# Patient Record
Sex: Female | Born: 1955
Health system: Southern US, Community
[De-identification: ages and names within clinical notes are randomized; demographics above are authoritative.]

## PROBLEM LIST (undated history)

## (undated) DIAGNOSIS — T7840XA Allergy, unspecified, initial encounter: Secondary | ICD-10-CM

## (undated) DIAGNOSIS — H269 Unspecified cataract: Secondary | ICD-10-CM

## (undated) DIAGNOSIS — M8580 Other specified disorders of bone density and structure, unspecified site: Secondary | ICD-10-CM

## (undated) DIAGNOSIS — E785 Hyperlipidemia, unspecified: Secondary | ICD-10-CM

## (undated) HISTORY — DX: Allergy, unspecified, initial encounter: T78.40XA

## (undated) HISTORY — PX: APPENDECTOMY: SHX54

## (undated) HISTORY — DX: Unspecified cataract: H26.9

## (undated) HISTORY — PX: COLONOSCOPY: SHX174

## (undated) HISTORY — DX: Other specified disorders of bone density and structure, unspecified site: M85.80

## (undated) HISTORY — DX: Hyperlipidemia, unspecified: E78.5

---

## 1999-02-09 ENCOUNTER — Other Ambulatory Visit: Admission: RE | Admit: 1999-02-09 | Discharge: 1999-02-09 | Payer: Self-pay | Admitting: Gynecology

## 2000-02-01 ENCOUNTER — Other Ambulatory Visit: Admission: RE | Admit: 2000-02-01 | Discharge: 2000-02-01 | Payer: Self-pay | Admitting: Gynecology

## 2001-03-25 ENCOUNTER — Other Ambulatory Visit: Admission: RE | Admit: 2001-03-25 | Discharge: 2001-03-25 | Payer: Self-pay | Admitting: Gynecology

## 2002-04-20 ENCOUNTER — Other Ambulatory Visit: Admission: RE | Admit: 2002-04-20 | Discharge: 2002-04-20 | Payer: Self-pay | Admitting: Obstetrics and Gynecology

## 2003-04-01 ENCOUNTER — Other Ambulatory Visit: Admission: RE | Admit: 2003-04-01 | Discharge: 2003-04-01 | Payer: Self-pay | Admitting: Gynecology

## 2004-10-12 ENCOUNTER — Other Ambulatory Visit: Admission: RE | Admit: 2004-10-12 | Discharge: 2004-10-12 | Payer: Self-pay | Admitting: Gynecology

## 2010-01-22 HISTORY — PX: APPENDECTOMY: SHX54

## 2010-11-06 ENCOUNTER — Ambulatory Visit (HOSPITAL_COMMUNITY)
Admission: EM | Admit: 2010-11-06 | Discharge: 2010-11-07 | Disposition: A | Discharge status: Home / Self Care | Payer: BC Managed Care – PPO | Attending: Surgery | Admitting: Surgery

## 2010-11-06 ENCOUNTER — Other Ambulatory Visit (INDEPENDENT_AMBULATORY_CARE_PROVIDER_SITE_OTHER): Payer: Self-pay | Admitting: Surgery

## 2010-11-06 ENCOUNTER — Emergency Department (HOSPITAL_BASED_OUTPATIENT_CLINIC_OR_DEPARTMENT_OTHER)
Admission: EM | Admit: 2010-11-06 | Discharge: 2010-11-06 | Disposition: A | Discharge status: Other Acute Inpatient Hospital | Payer: BC Managed Care – PPO | Source: Home / Self Care | Attending: Emergency Medicine | Admitting: Emergency Medicine

## 2010-11-06 ENCOUNTER — Emergency Department (INDEPENDENT_AMBULATORY_CARE_PROVIDER_SITE_OTHER): Payer: BC Managed Care – PPO

## 2010-11-06 DIAGNOSIS — N390 Urinary tract infection, site not specified: Secondary | ICD-10-CM | POA: Insufficient documentation

## 2010-11-06 DIAGNOSIS — R1031 Right lower quadrant pain: Secondary | ICD-10-CM

## 2010-11-06 DIAGNOSIS — K3580 Unspecified acute appendicitis: Secondary | ICD-10-CM | POA: Insufficient documentation

## 2010-11-06 DIAGNOSIS — N200 Calculus of kidney: Secondary | ICD-10-CM | POA: Insufficient documentation

## 2010-11-06 DIAGNOSIS — K37 Unspecified appendicitis: Secondary | ICD-10-CM

## 2010-11-06 DIAGNOSIS — Q619 Cystic kidney disease, unspecified: Secondary | ICD-10-CM | POA: Insufficient documentation

## 2010-11-06 DIAGNOSIS — R112 Nausea with vomiting, unspecified: Secondary | ICD-10-CM

## 2010-11-06 DIAGNOSIS — R110 Nausea: Secondary | ICD-10-CM | POA: Insufficient documentation

## 2010-11-06 LAB — COMPREHENSIVE METABOLIC PANEL
ALT: 24 U/L (ref 0–35)
BUN: 12 mg/dL (ref 6–23)
CO2: 27 mEq/L (ref 19–32)
Calcium: 9.3 mg/dL (ref 8.4–10.5)
Creatinine, Ser: 0.7 mg/dL (ref 0.50–1.10)
GFR calc Af Amer: >90 mL/min (ref >90)
GFR calc non Af Amer: >90 mL/min (ref >90)
Glucose, Bld: 128 mg/dL — ABNORMAL HIGH (ref 70–99)
Total Protein: 7.2 g/dL (ref 6.0–8.3)

## 2010-11-06 LAB — DIFFERENTIAL
Eosinophils Absolute: 0 10*3/uL (ref 0.0–0.7)
Eosinophils Relative: 0 % (ref 0–5)
Lymphocytes Relative: 5 % — ABNORMAL LOW (ref 12–46)
Lymphs Abs: 0.9 10*3/uL (ref 0.7–4.0)
Monocytes Absolute: 1.2 10*3/uL — ABNORMAL HIGH (ref 0.1–1.0)
Monocytes Relative: 8 % (ref 3–12)

## 2010-11-06 LAB — CBC
HCT: 44.5 % (ref 36.0–46.0)
MCH: 33.1 pg (ref 26.0–34.0)
MCV: 93.9 fL (ref 78.0–100.0)
RBC: 4.74 MIL/uL (ref 3.87–5.11)
WBC: 16.2 10*3/uL — ABNORMAL HIGH (ref 4.0–10.5)

## 2010-11-06 LAB — URINALYSIS, ROUTINE W REFLEX MICROSCOPIC
Hgb urine dipstick: NEGATIVE
Nitrite: NEGATIVE
Protein, ur: 30 mg/dL — AB
Urobilinogen, UA: 0.2 mg/dL (ref 0.0–1.0)

## 2010-11-06 LAB — URINE MICROSCOPIC-ADD ON

## 2010-11-06 LAB — LIPASE, BLOOD: Lipase: 25 U/L (ref 11–59)

## 2010-11-06 MED ORDER — IOHEXOL 300 MG/ML  SOLN
100.0000 mL | Freq: Once | INTRAMUSCULAR | Status: AC | PRN
  Administered 2010-11-06: 100 mL via INTRAVENOUS

## 2010-11-06 MED ORDER — METRONIDAZOLE IN NACL 5-0.79 MG/ML-% IV SOLN
500.0000 mg | Freq: Once | INTRAVENOUS | Status: AC
  Administered 2010-11-06: 500 mg via INTRAVENOUS
  Filled 2010-11-06: qty 100

## 2010-11-06 MED ORDER — SODIUM CHLORIDE 0.9 % IV SOLN
Freq: Once | INTRAVENOUS | Status: AC
  Administered 2010-11-06: 11:00:00 via INTRAVENOUS

## 2010-11-06 MED ORDER — ONDANSETRON HCL 4 MG/2ML IJ SOLN
4.0000 mg | Freq: Once | INTRAMUSCULAR | Status: AC
  Administered 2010-11-06: 4 mg via INTRAVENOUS
  Filled 2010-11-06: qty 2

## 2010-11-06 MED ORDER — MORPHINE SULFATE 4 MG/ML IJ SOLN
4.0000 mg | Freq: Once | INTRAMUSCULAR | Status: AC
  Administered 2010-11-06: 4 mg via INTRAVENOUS

## 2010-11-06 MED ORDER — SODIUM CHLORIDE 0.9 % IV BOLUS (SEPSIS)
1000.0000 mL | Freq: Once | INTRAVENOUS | Status: AC
  Administered 2010-11-06: 1000 mL via INTRAVENOUS

## 2010-11-06 MED ORDER — CIPROFLOXACIN IN D5W 400 MG/200ML IV SOLN
400.0000 mg | Freq: Once | INTRAVENOUS | Status: AC
Start: 2010-11-06 — End: 2010-11-06
  Administered 2010-11-06: 400 mg via INTRAVENOUS
  Filled 2010-11-06: qty 200

## 2010-11-06 MED ORDER — MORPHINE SULFATE 4 MG/ML IJ SOLN
INTRAMUSCULAR | Status: AC
  Administered 2010-11-06: 4 mg via INTRAVENOUS
  Filled 2010-11-06: qty 1

## 2010-11-06 MED ORDER — MORPHINE SULFATE 4 MG/ML IJ SOLN
4.0000 mg | Freq: Once | INTRAMUSCULAR | Status: AC
  Administered 2010-11-06: 4 mg via INTRAVENOUS
  Filled 2010-11-06: qty 1

## 2010-11-06 NOTE — ED Notes (Signed)
Temp of 101.1

## 2010-11-06 NOTE — ED Notes (Signed)
Report called to [facility] 

## 2010-11-06 NOTE — ED Notes (Signed)
Abdominal pain and nausea that started yesterday

## 2010-11-06 NOTE — ED Notes (Signed)
Dr. Hyman Hopes at bedside speaking with pt and family regarding plan of care.

## 2010-11-06 NOTE — ED Notes (Signed)
Pt drinking PO contrast.  Pt and spouse informed of plan of care.

## 2010-11-06 NOTE — ED Notes (Signed)
[facility] at bedside and preparing for transport.  IV documented as d/c'd but pt actually left in place for transport.

## 2010-11-06 NOTE — ED Notes (Signed)
Pt transported to CT ?

## 2010-11-06 NOTE — ED Provider Notes (Signed)
History     CSN: [id number] Arrival date & time: 11/06/2010  7:15 AM  Chief Complaint  Patient presents with  . Abdominal Pain  . Nausea    (Consider location/radiation/quality/duration/timing/severity/associated sxs/prior treatment) HPI 55 year old female previously healthy presents with abdominal pain. Patient states that she began to have diffuse greater than right lower quadrant abdominal pain yesterday. The pain has been crampy and constant with intermittent worsening. Patient complain of nausea and vomiting. She states that she has been unable to tolerate by mouth at home. She denies any back pain. She denies constipation, diarrhea, blood in her stool. She denies any hematuria dysuria frequency urgency. Patient has reported low-grade fevers at home to 100.1.  No sick contacts. She denies chest pain shortness of breath. She has a mild headache she denies neck stiffness, dizziness. She denies rash. No history of abdominal surgeries  History reviewed. No pertinent past medical history.  History reviewed. No pertinent past surgical history.  No family history on file.  History  Substance Use Topics  . Smoking status: Never Smoker   . Smokeless tobacco: Never Used  . Alcohol Use: Yes     wine daily    OB History    Grav Para Term Preterm Abortions TAB SAB Ect Mult Living                  Review of Systems Negative except as noted in history of present illness  Allergies  Duricef  Home Medications  No current outpatient prescriptions on file. none  BP 105/61  Pulse 89  Temp(Src) 99 F (37.2 C) (Oral)  Resp 16  Ht 5\' 3"  (1.6 m)  Wt 102 lb (46.267 kg)  BMI 18.07 kg/m2  SpO2 99%  Physical Exam  Nursing note and vitals reviewed. Constitutional: She is oriented to person, place, and time. She appears well-developed.        Appears uncomfortable  HENT:  Head: Atraumatic.  Mouth/Throat: Oropharynx is clear and moist.  Eyes: Conjunctivae and EOM are normal.  Pupils are equal, round, and reactive to light.  Neck: Normal range of motion. Neck supple.  Cardiovascular: Normal rate, regular rhythm, normal heart sounds and intact distal pulses.   Pulmonary/Chest: Effort normal and breath sounds normal. No respiratory distress. She has no wheezes. She has no rales.  Abdominal: Soft. She exhibits no distension and no mass. There is tenderness. There is no rebound and no guarding.       Diffuse lower abdominal and periumbilical tenderness to palpation  Musculoskeletal: Normal range of motion.  Neurological: She is alert and oriented to person, place, and time.  Skin: Skin is warm and dry. No rash noted.  Psychiatric: She has a normal mood and affect.    ED Course  Procedures (including critical care time)  Labs Reviewed  CBC - Abnormal; Notable for the following:    WBC 16.2 (*)    Hemoglobin 15.7 (*)    All other components within normal limits  DIFFERENTIAL - Abnormal; Notable for the following:    Neutrophils Relative 87 (*)    Neutro Abs 14.1 (*)    Lymphocytes Relative 5 (*)    Monocytes Absolute 1.2 (*)    All other components within normal limits  COMPREHENSIVE METABOLIC PANEL - Abnormal; Notable for the following:    Glucose, Bld 128 (*)    All other components within normal limits  URINALYSIS, ROUTINE W REFLEX MICROSCOPIC - Abnormal; Notable for the following:    Color, Urine AMBER (*)  BIOCHEMICALS MAY BE AFFECTED BY COLOR   Appearance CLOUDY (*)    Ketones, ur 15 (*)    Protein, ur 30 (*)    Leukocytes, UA SMALL (*)    All other components within normal limits  URINE MICROSCOPIC-ADD ON - Abnormal; Notable for the following:    Bacteria, UA MANY (*)    All other components within normal limits  LIPASE, BLOOD   Ct Abdomen Pelvis W Contrast  11/06/2010  *RADIOLOGY REPORT*  Clinical Data: Right lower quadrant abdominal pain.  CT ABDOMEN AND PELVIS WITH CONTRAST  Technique:  Multidetector CT imaging of the abdomen and pelvis was  performed following the standard protocol during bolus administration of intravenous contrast.  Contrast: OMNIPAQUE IOHEXOL 300 MG/ML IV SOLN  Comparison: None  Findings: The lung bases are clear.  The solid abdominal organs are unremarkable.  Mild periportal edema is noted in the liver.  The gallbladder is unremarkable.  The common bile duct and pancreatic duct are normal. A 5 mm renal calculus is noted in the right renal pelvis.  Small renal cysts are noted.  The stomach, duodenum, small bowel and colon are unremarkable.  The appendix is fluid filled, dilated and demonstrates mucosal enhancement and significant periappendiceal inflammatory change all consistent with acute appendicitis.  The uterus and ovaries are unremarkable.  There is a small amount of free pelvic fluid.  The bladder appears normal.  The aorta is normal in caliber.  The major branch vessels are normal.  No mesenteric or retroperitoneal masses or adenopathy.  The bony structures are unremarkable.  IMPRESSION: CT findings consistent with acute appendicitis.  Original Report Authenticated By: P. Loralie Champagne, M.D.     1. Appendicitis       MDM  55 year old female with nausea, abdominal pain, low-grade fever at home. CT abdomen and pelvis to evaluate for appendicitis, colitis. UA, labs. A fluids Zofran and morphine. Reassess.  Stefano Gaul, MD   Acute appy. Abx given. D/W Surgery-- transfer to [facility]. D/W ER MD at [facility], MD 11/06/10 [phone]

## 2010-11-06 NOTE — ED Notes (Signed)
MD at bedside. 

## 2010-11-06 NOTE — ED Notes (Signed)
Pt returned from CT °

## 2010-11-06 NOTE — ED Notes (Signed)
Pt transported to [facility] ED via [facility] and stable upon transfer. Pain 2/10.

## 2010-11-07 NOTE — Op Note (Signed)
  NAMECADE, OLBERDING                 ACCOUNT NO.:  [id number]  MEDICAL RECORD NO.:  [id number]  LOCATION:  0098                         FACILITY:  [facility]  PHYSICIAN:  Wilmon Arms. Corliss Skains, M.D. DATE OF BIRTH:  07/29/1955  DATE OF PROCEDURE:  11/06/2010 DATE OF DISCHARGE:                              OPERATIVE REPORT   PREOPERATIVE DIAGNOSIS:  Acute appendicitis.  POSTOPERATIVE DIAGNOSIS:  Acute appendicitis.  PROCEDURE:  Laparoscopic appendectomy.  SURGEON:  Wilmon Arms. Chamia Schmutz, M.D.  ANESTHESIA:  General.  INDICATIONS:  This is a 55 year old female, who was diagnosed with acute appendicitis at [facility] was transferred for further treatment.  We recommended immediate laparoscopic appendectomy.  DESCRIPTION OF PROCEDURE:  Patient brought to the operating room, placed supine position on operating table.  After an adequate level of general anesthesia was obtained, patient's abdomen was prepped with ChloraPrep, draped in sterile fashion.  Time-out was taken to the proper patient, proper procedure.  A Foley had been placed to decompress the bladder. We infiltrated the area just above her umbilicus with 0.25% Marcaine with epinephrine.  A transverse incision was made.  Dissection was carried down the fascia.  The fascia was opened vertically.  We entered the peritoneal cavity bluntly.  A stay suture of 0 Vicryl was placed around the fascial opening.  The Hasson cannula was inserted, secured to stay suture.  Pneumoperitoneum was obtained, insufflating CO2, maintaining maximal pressure of 15 mmHg.  The laparoscope was inserted and the patient was positioned in Trendelenburg, tilted to her left.  A 5 mm ports placed in right upper quadrant.  Another 5 mm port was placed in the left lower quadrant.  We moved the scope to the right upper quadrant port site.  We mobilized the cecum medially.  A large inflamed nonperforated appendix was identified.  We mobilized the appendix  by dividing some of this adhesions with the Harmonic Scalpel.  Once we had mobilized the appendix, we divided the mesoappendix with the Harmonic Scalpel.  The base of the appendix was then divided with the blue Endo- GIA stapler.  The appendix was placed in EndoCatch sac.  We inspected the staple line, which seemed hemostatic with no sign of leak.  We thoroughly irrigated the right lower quadrant, no bleeding was noted. The appendix was then removed through the umbilical port site.  The fascia was closed with the purse-string suture.  Pneumoperitoneum was then released as we removed the trocars.  A 4-0 Monocryl was used to close skin incisions.  Steri- Strips and clean dressings were applied.  The patient was extubated and brought to the recovery room in stable condition.  All sponge, instrument, needle counts were correct.     Wilmon Arms. Corliss Skains, M.D.     MKT/MEDQ  D:  11/06/2010  T:  11/07/2010  Job:  [id number]  Electronically Signed by Manus Rudd M.D. on 11/07/2010 03:25:05 PM

## 2010-11-15 NOTE — H&P (Signed)
NAMECHASTITY, [name] Nicholson                 ACCOUNT NO.:  [id number]  MEDICAL RECORD NO.:  [id number]  LOCATION:  WLED                         FACILITY:  [facility]  PHYSICIAN:  [name] Nicholson, M.D. DATE OF BIRTH:  July 07, 1955  DATE OF ADMISSION:  11/06/2010 DATE OF DISCHARGE:                             HISTORY & PHYSICAL   REQUESTING PHYSICIAN:  Dr. Radford Nicholson, ER.  PRIMARY CARE PHYSICIAN:  None.  She sees Dr. Jennette Nicholson, OB/GYN.  CHIEF COMPLAINT:  Abdominal pain.  BRIEF HISTORY:  The patient is a 55 year old white female who is in excellent health normally.  She woke up Sunday a.m. with pain, had 3 episodes of nausea and dry heaves throughout the day.  She was unable to eat throughout the day on Sunday.  She thought she had a virus.  It has become progressively worse.  She did spike some temperatures at home, and she ultimately presented to the ER at [facility] around 6:00 am. Workup at [facility] included CBC which shows a white count of 16,200, hemoglobin 15, hematocrit 44, and platelets are 215,000.  Sodium is 137, potassium is 4.1, chloride is 101, CO2 is 27, BUN is 12, creatinine is 0.7, and glucose is 128.  Lipase was 25.  LFTs were normal.  CT of the abdomen shows the solid organs are unremarkable.  There is some periportal edema noted in the liver.  The gallbladder was unremarkable. Common bile duct and pancreatic ducts were normal.  There was a 5 mm renal calculus noted on the right.  There is a small renal cysts noted. Stomach, duodenum, small bowel, and colon are unremarkable.  The appendix is filled with food, dilated and demonstrated mucosal enhancement and significant periappendiceal inflammatory changes all consistent with acute appendicitis.  The uterus and ovaries were unremarkable.  There is a small amount of free pelvic fluid.  The bladder appears normal.  UA on admission showed 21-50 wbc's also.  PAST MEDICAL HISTORY:  No prior medical history.  PAST SURGICAL HISTORY:   None.  FAMILY HISTORY:  Father died at 23 with an MI.  Mother died at 52 with a stroke.  One brother in good health at 49.  Two sisters, one with some coronary artery disease.  SOCIAL HISTORY:  Tobacco none.  Alcohol, social. A glass of wine per day.  Drugs none.  She is married.  She was at home.  She goes to the gym almost daily Monday through Friday, 5 days a week, and cares for her grandchildren.  REVIEW OF SYSTEMS:  Fever:  None till yesterday, 101 on and off yesterday.  SKIN:  No changes.  CEREBROVASCULAR:  No changes. PULMONARY:  No shortness of breath, DOE or PND.  She works at [employer] with no issues.  CARDIAC:  No chest pain or palpitations.  GI:  Negative for GERD.  She has had nausea and dry heaves yesterday.  She has been unable to eat since yesterday.  No diarrhea or constipation or blood in her stool.  GYN:  No periods for 2 years.  MUSCULOSKELETAL:  No changes. PSYCH:  No changes.  CURRENT ALLERGIES:  None.  ALLERGIES:  DURICEF causes rash.  PHYSICAL  EXAMINATION:  GENERAL:  This is a thin well-nourished white female, in no acute distress.  VITAL SIGNS:  Temperature currently is 99.6, heart rate is 86, blood pressure is 100/61 respiratory rate is 18, and sats are 98% on room air. HEAD:  Normocephalic. EARS, NOSE, THROAT, AND MOUTH:  All within normal limits.  Trachea is in the midline.  No bruits, JVD, or thyromegaly.  CHEST:  Clear to auscultation and percussion. CARDIAC:  Normal S1 and S2. ABDOMEN:  Normal bowel sounds.  She is not distended.  She is tender in the right lower quadrant in the mid to left lower quadrant.  No hernias, masses, or abscesses noted. GU/RECTAL:  Deferred. MUSCULOSKELETAL:  No changes. NEUROLOGIC:  No focal changes.  Cranial nerves grossly intact. PSYCH:  Normal affect.  IMPRESSION: 1. Acute appendicitis. 2. Urinary tract infection.  PLAN:  The patient has been treated at [facility] with Cipro and Flagyl, morphine and Zofran.  We  will plan to take her to the OR as soon as possible.  Risks and benefits were discussed.  The patient, husband and daughter were in attendance and were in agreement.     [name] Nicholson, P.A.   ______________________________ [name] Nicholson, M.D.    WDJ/MEDQ  D:  11/06/2010  T:  11/06/2010  Job:  [id number]  Electronically Signed by [name] Nicholson P.A. on 11/12/2010 01:24:18 PM Electronically Signed by [name] Nicholson M.D. on 11/15/2010 04:37:31 AM

## 2010-11-28 ENCOUNTER — Encounter (INDEPENDENT_AMBULATORY_CARE_PROVIDER_SITE_OTHER): Payer: Self-pay | Admitting: Surgery

## 2010-11-30 ENCOUNTER — Ambulatory Visit (INDEPENDENT_AMBULATORY_CARE_PROVIDER_SITE_OTHER): Payer: BC Managed Care – PPO | Admitting: Surgery

## 2010-11-30 ENCOUNTER — Encounter (INDEPENDENT_AMBULATORY_CARE_PROVIDER_SITE_OTHER): Payer: Self-pay | Admitting: Surgery

## 2010-11-30 VITALS — BP 126/84 | HR 60 | Temp 98.4°F | Resp 12 | Ht 63.0 in | Wt 101.4 lb

## 2010-11-30 DIAGNOSIS — Z9049 Acquired absence of other specified parts of digestive tract: Secondary | ICD-10-CM

## 2010-11-30 DIAGNOSIS — Z9889 Other specified postprocedural states: Secondary | ICD-10-CM

## 2010-11-30 DIAGNOSIS — K3580 Unspecified acute appendicitis: Secondary | ICD-10-CM | POA: Insufficient documentation

## 2010-11-30 NOTE — Progress Notes (Signed)
S/p lap appy on 11/06/10 for acute appendicitis.  The patient was discharged on POD#1.  She has done very well.  She never used any narcotic pain medication.  Appetite and bowel movements are normal.  Incisions are healing with no sign of infection.    Resume full activity. Follow-up PRn.Marland Kitchen

## 2010-11-30 NOTE — Patient Instructions (Signed)
Resume full activity  

## 2011-01-01 ENCOUNTER — Other Ambulatory Visit: Payer: Self-pay | Admitting: Gynecology

## 2011-01-04 ENCOUNTER — Other Ambulatory Visit: Payer: Self-pay | Admitting: Gynecology

## 2011-01-04 DIAGNOSIS — R928 Other abnormal and inconclusive findings on diagnostic imaging of breast: Secondary | ICD-10-CM

## 2011-01-18 ENCOUNTER — Ambulatory Visit
Admission: RE | Admit: 2011-01-18 | Discharge: 2011-01-18 | Disposition: A | Discharge status: Home / Self Care | Payer: BC Managed Care – PPO | Source: Ambulatory Visit | Attending: Gynecology | Admitting: Gynecology

## 2011-01-18 DIAGNOSIS — R928 Other abnormal and inconclusive findings on diagnostic imaging of breast: Secondary | ICD-10-CM

## 2011-01-24 ENCOUNTER — Other Ambulatory Visit: Payer: BC Managed Care – PPO

## 2011-12-27 ENCOUNTER — Ambulatory Visit (INDEPENDENT_AMBULATORY_CARE_PROVIDER_SITE_OTHER): Payer: BC Managed Care – PPO | Admitting: Internal Medicine

## 2011-12-27 ENCOUNTER — Encounter: Payer: Self-pay | Admitting: Internal Medicine

## 2011-12-27 VITALS — BP 110/70 | HR 83 | Temp 97.6°F | Resp 16 | Ht 63.0 in | Wt 101.0 lb

## 2011-12-27 DIAGNOSIS — E785 Hyperlipidemia, unspecified: Secondary | ICD-10-CM

## 2011-12-27 DIAGNOSIS — Z780 Asymptomatic menopausal state: Secondary | ICD-10-CM

## 2011-12-27 NOTE — Progress Notes (Signed)
  Subjective:    Patient ID: [name], female    DOB: December 28, 1955, 56 y.o.   MRN: [id number]  HPI New pt here for first visit.  Former primary care by [employer].  PMH mild hyperlipidemia and menopause  Not very symptomatic with menopausal symptoms  Former mm with Dr.Bowie   Allergies  Allergen Reactions  . Duricef (Cefadroxil Monohydrate)    History reviewed. No pertinent past medical history. Past Surgical History  Procedure Date  . Appendectomy 2012   History   Social History  . Marital Status: Married    Spouse Name: N/A    Number of Children: N/A  . Years of Education: N/A   Occupational History  . Not on file.   Social History Main Topics  . Smoking status: Never Smoker   . Smokeless tobacco: Never Used  . Alcohol Use: 0.6 oz/week    1 Glasses of wine per week     Comment: wine daily  . Drug Use: No  . Sexually Active: Yes    Birth Control/ Protection: Post-menopausal   Other Topics Concern  . Not on file   Social History Narrative  . No narrative on file   Family History  Problem Relation Age of Onset  . Heart disease Father   . Stroke Mother    Patient Active Problem List  Diagnosis  . Acute appendicitis   No current outpatient prescriptions on file prior to visit.       Review of Systems See HPI    Objective:   Physical Exam Physical Exam  Nursing note and vitals reviewed.  Constitutional: She is oriented to person, place, and time. She appears well-developed and well-nourished.  HENT:  Head: Normocephalic and atraumatic.  Cardiovascular: Normal rate and regular rhythm. Exam reveals no gallop and no friction rub.  No murmur heard.  Pulmonary/Chest: Breath sounds normal. She has no wheezes. She has no rales.  Neurological: She is alert and oriented to person, place, and time.  Skin: Skin is warm and dry.  Psychiatric: She has a normal mood and affect. Her behavior is normal.  Ext:  No edema            Assessment & Plan:   Hyperlipidemia  Will check when pt is fasting  Menopause  Pt to schedule CPE

## 2011-12-27 NOTE — Patient Instructions (Addendum)
Schedule CPE

## 2012-01-28 ENCOUNTER — Telehealth: Payer: Self-pay | Admitting: *Deleted

## 2012-01-30 ENCOUNTER — Telehealth: Payer: Self-pay | Admitting: Internal Medicine

## 2012-02-07 NOTE — Telephone Encounter (Signed)
Returned pt call regarding billing

## 2012-02-20 ENCOUNTER — Encounter: Payer: BC Managed Care – PPO | Admitting: Internal Medicine

## 2012-02-29 ENCOUNTER — Encounter: Payer: Self-pay | Admitting: *Deleted

## 2012-05-29 ENCOUNTER — Other Ambulatory Visit: Payer: Self-pay | Admitting: Gynecology

## 2013-06-01 ENCOUNTER — Other Ambulatory Visit: Payer: Self-pay | Admitting: Gynecology

## 2013-11-23 ENCOUNTER — Encounter: Payer: Self-pay | Admitting: Internal Medicine

## 2014-05-23 LAB — HM PAP SMEAR: HM Pap smear: NORMAL

## 2014-05-23 LAB — HM MAMMOGRAPHY: HM MAMMO: NORMAL

## 2015-03-02 ENCOUNTER — Telehealth: Payer: Self-pay | Admitting: Behavioral Health

## 2015-03-02 ENCOUNTER — Encounter: Payer: Self-pay | Admitting: Behavioral Health

## 2015-03-02 NOTE — Telephone Encounter (Signed)
Pre-Visit Call completed with patient and chart updated.   Pre-Visit Info documented in Specialty Comments under [employer].    

## 2015-03-03 ENCOUNTER — Ambulatory Visit (INDEPENDENT_AMBULATORY_CARE_PROVIDER_SITE_OTHER): Payer: BLUE CROSS/BLUE SHIELD | Admitting: Family Medicine

## 2015-03-03 ENCOUNTER — Encounter: Payer: Self-pay | Admitting: Family Medicine

## 2015-03-03 VITALS — BP 100/60 | HR 82 | Temp 98.0°F | Ht 63.0 in | Wt 98.4 lb

## 2015-03-03 DIAGNOSIS — Z23 Encounter for immunization: Secondary | ICD-10-CM | POA: Diagnosis not present

## 2015-03-03 DIAGNOSIS — Z114 Encounter for screening for human immunodeficiency virus [HIV]: Secondary | ICD-10-CM

## 2015-03-03 DIAGNOSIS — Z0000 Encounter for general adult medical examination without abnormal findings: Secondary | ICD-10-CM | POA: Diagnosis not present

## 2015-03-03 DIAGNOSIS — Z1159 Encounter for screening for other viral diseases: Secondary | ICD-10-CM | POA: Diagnosis not present

## 2015-03-03 NOTE — Progress Notes (Signed)
Subjective:     [name] is a 60 y.o. female and is here for a comprehensive physical exam. The patient reports no problems.  Social History   Social History  . Marital Status: Married    Spouse Name: N/A  . Number of Children: N/A  . Years of Education: N/A   Occupational History  . Not on file.   Social History Main Topics  . Smoking status: Never Smoker   . Smokeless tobacco: Never Used  . Alcohol Use: 0.6 oz/week    1 Glasses of wine per week     Comment: wine daily  . Drug Use: No  . Sexual Activity: Yes    Birth Control/ Protection: Post-menopausal   Other Topics Concern  . Not on file   Social History Narrative   Health Maintenance  Topic Date Due  . Hepatitis C Screening  1955/05/19  . HIV Screening  02/09/1969  . TETANUS/TDAP  02/09/1973  . COLONOSCOPY  02/10/2004  . ZOSTAVAX  02/09/2014  . INFLUENZA VACCINE  08/23/2015  . MAMMOGRAM  05/22/2016  . PAP SMEAR  05/22/2017    The following portions of the patient's history were reviewed and updated as appropriate:  She  has no past medical history on file. She  does not have any pertinent problems on file. She  has past surgical history that includes Appendectomy (2012). Her family history includes Heart disease in her father; Stroke in her mother. She  reports that she has never smoked. She has never used smokeless tobacco. She reports that she drinks about 0.6 oz of alcohol per week. She reports that she does not use illicit drugs. She has a current medication list which includes the following prescription(s): aspirin and multivitamin with minerals. Current Outpatient Prescriptions on File Prior to Visit  Medication Sig Dispense Refill  . aspirin 81 MG tablet Take 81 mg by mouth daily.    . Multiple Vitamins-Minerals (MULTIVITAMIN WITH MINERALS) tablet Take 1 tablet by mouth daily.     No current facility-administered medications on file prior to visit.   She is allergic to duricef..  Review of  Systems Review of Systems  Constitutional: Negative for activity change, appetite change and fatigue.  HENT: Negative for hearing loss, congestion, tinnitus and ear discharge.  dentist q30mEyes: Negative for visual disturbance (see optho q1y -- vision corrected to 20/20 with glasses).  Respiratory: Negative for cough, chest tightness and shortness of breath.   Cardiovascular: Negative for chest pain, palpitations and leg swelling.  Gastrointestinal: Negative for abdominal pain, diarrhea, constipation and abdominal distention.  Genitourinary: Negative for urgency, frequency, decreased urine volume and difficulty urinating.  Musculoskeletal: Negative for back pain, arthralgias and gait problem.  Skin: Negative for color change, pallor and rash.  Neurological: Negative for dizziness, light-headedness, numbness and headaches.  Hematological: Negative for adenopathy. Does not bruise/bleed easily.  Psychiatric/Behavioral: Negative for suicidal ideas, confusion, sleep disturbance, self-injury, dysphoric mood, decreased concentration and agitation.       Objective:    BP 100/60 mmHg  Pulse 82  Temp(Src) 98 F (36.7 C) (Oral)  Ht '5\' 3"'  (1.6 m)  Wt 98 lb 6.4 oz (44.634 kg)  BMI 17.44 kg/m2  SpO2 97% General appearance: alert, cooperative, appears stated age and no distress Head: Normocephalic, without obvious abnormality, atraumatic Eyes: conjunctivae/corneas clear. PERRL, EOM's intact. Fundi benign. Ears: normal TM's and external ear canals both ears Nose: Nares normal. Septum midline. Mucosa normal. No drainage or sinus tenderness. Throat: lips, mucosa, and  tongue normal; teeth and gums normal Neck: no adenopathy, no carotid bruit, no JVD, supple, symmetrical, trachea midline and thyroid not enlarged, symmetric, no tenderness/mass/nodules Back: symmetric, no curvature. ROM normal. No CVA tenderness. Lungs: clear to auscultation bilaterally Breasts: gyn Heart: regular rate and rhythm,  S1, S2 normal, no murmur, click, rub or gallop Abdomen: soft, non-tender; bowel sounds normal; no masses,  no organomegaly Pelvic: deferred-gyn Extremities: extremities normal, atraumatic, no cyanosis or edema Pulses: 2+ and symmetric Skin: Skin color, texture, turgor normal. No rashes or lesions Lymph nodes: Cervical, supraclavicular, and axillary nodes normal. Neurologic: Alert and oriented X 3, normal strength and tone. Normal symmetric reflexes. Normal coordination and gait Psych-- no depression, no anxiety      Assessment:    Healthy female exam.      Plan:    ghm utd Check labs  See After Visit Summary for Counseling Recommendations    1. Preventative health care See above - Ambulatory referral to Gastroenterology - Comp Met (CMET) - CBC with Differential/Platelet - HIV antibody - Lipid panel - POCT urinalysis dipstick - TSH - Hepatitis C antibody  2. Need for hepatitis C screening test   - Hepatitis C antibody  3. Screening for HIV (human immunodeficiency virus)   - HIV antibody

## 2015-03-03 NOTE — Patient Instructions (Signed)
Preventive Care for Adults, Female A healthy lifestyle and preventive care can promote health and wellness. Preventive health guidelines for women include the following key practices.  A routine yearly physical is a good way to check with your health care provider about your health and preventive screening. It is a chance to share any concerns and updates on your health and to receive a thorough exam.  Visit your dentist for a routine exam and preventive care every 6 months. Brush your teeth twice a day and floss once a day. Good oral hygiene prevents tooth decay and gum disease.  The frequency of eye exams is based on your age, health, family medical history, use of contact lenses, and other factors. Follow your health care provider's recommendations for frequency of eye exams.  Eat a healthy diet. Foods like vegetables, fruits, whole grains, low-fat dairy products, and lean protein foods contain the nutrients you need without too many calories. Decrease your intake of foods high in solid fats, added sugars, and salt. Eat the right amount of calories for you.Get information about a proper diet from your health care provider, if necessary.  Regular physical exercise is one of the most important things you can do for your health. Most adults should get at least 150 minutes of moderate-intensity exercise (any activity that increases your heart rate and causes you to sweat) each week. In addition, most adults need muscle-strengthening exercises on 2 or more days a week.  Maintain a healthy weight. The body mass index (BMI) is a screening tool to identify possible weight problems. It provides an estimate of body fat based on height and weight. Your health care provider can find your BMI and can help you achieve or maintain a healthy weight.For adults 20 years and older:  A BMI below 18.5 is considered underweight.  A BMI of 18.5 to 24.9 is normal.  A BMI of 25 to 29.9 is considered overweight.  A  BMI of 30 and above is considered obese.  Maintain normal blood lipids and cholesterol levels by exercising and minimizing your intake of saturated fat. Eat a balanced diet with plenty of fruit and vegetables. Blood tests for lipids and cholesterol should begin at age 45 and be repeated every 5 years. If your lipid or cholesterol levels are high, you are over 50, or you are at high risk for heart disease, you may need your cholesterol levels checked more frequently.Ongoing high lipid and cholesterol levels should be treated with medicines if diet and exercise are not working.  If you smoke, find out from your health care provider how to quit. If you do not use tobacco, do not start.  Lung cancer screening is recommended for adults aged 45-80 years who are at high risk for developing lung cancer because of a history of smoking. A yearly low-dose CT scan of the lungs is recommended for people who have at least a 30-pack-year history of smoking and are a current smoker or have quit within the past 15 years. A pack year of smoking is smoking an average of 1 pack of cigarettes a day for 1 year (for example: 1 pack a day for 30 years or 2 packs a day for 15 years). Yearly screening should continue until the smoker has stopped smoking for at least 15 years. Yearly screening should be stopped for people who develop a health problem that would prevent them from having lung cancer treatment.  If you are pregnant, do not drink alcohol. If you are  breastfeeding, be very cautious about drinking alcohol. If you are not pregnant and choose to drink alcohol, do not have more than 1 drink per day. One drink is considered to be 12 ounces (355 mL) of beer, 5 ounces (148 mL) of wine, or 1.5 ounces (44 mL) of liquor.  Avoid use of street drugs. Do not share needles with anyone. Ask for help if you need support or instructions about stopping the use of drugs.  High blood pressure causes heart disease and increases the risk  of stroke. Your blood pressure should be checked at least every 1 to 2 years. Ongoing high blood pressure should be treated with medicines if weight loss and exercise do not work.  If you are 55-79 years old, ask your health care provider if you should take aspirin to prevent strokes.  Diabetes screening is done by taking a blood sample to check your blood glucose level after you have not eaten for a certain period of time (fasting). If you are not overweight and you do not have risk factors for diabetes, you should be screened once every 3 years starting at age 45. If you are overweight or obese and you are 40-70 years of age, you should be screened for diabetes every year as part of your cardiovascular risk assessment.  Breast cancer screening is essential preventive care for women. You should practice "breast self-awareness." This means understanding the normal appearance and feel of your breasts and may include breast self-examination. Any changes detected, no matter how small, should be reported to a health care provider. Women in their 20s and 30s should have a clinical breast exam (CBE) by a health care provider as part of a regular health exam every 1 to 3 years. After age 40, women should have a CBE every year. Starting at age 40, women should consider having a mammogram (breast X-ray test) every year. Women who have a family history of breast cancer should talk to their health care provider about genetic screening. Women at a high risk of breast cancer should talk to their health care providers about having an MRI and a mammogram every year.  Breast cancer gene (BRCA)-related cancer risk assessment is recommended for women who have family members with BRCA-related cancers. BRCA-related cancers include breast, ovarian, tubal, and peritoneal cancers. Having family members with these cancers may be associated with an increased risk for harmful changes (mutations) in the breast cancer genes BRCA1 and  BRCA2. Results of the assessment will determine the need for genetic counseling and BRCA1 and BRCA2 testing.  Your health care provider may recommend that you be screened regularly for cancer of the pelvic organs (ovaries, uterus, and vagina). This screening involves a pelvic examination, including checking for microscopic changes to the surface of your cervix (Pap test). You may be encouraged to have this screening done every 3 years, beginning at age 21.  For women ages 30-65, health care providers may recommend pelvic exams and Pap testing every 3 years, or they may recommend the Pap and pelvic exam, combined with testing for human papilloma virus (HPV), every 5 years. Some types of HPV increase your risk of cervical cancer. Testing for HPV may also be done on women of any age with unclear Pap test results.  Other health care providers may not recommend any screening for nonpregnant women who are considered low risk for pelvic cancer and who do not have symptoms. Ask your health care provider if a screening pelvic exam is right for   you.  If you have had past treatment for cervical cancer or a condition that could lead to cancer, you need Pap tests and screening for cancer for at least 20 years after your treatment. If Pap tests have been discontinued, your risk factors (such as having a new sexual partner) need to be reassessed to determine if screening should resume. Some women have medical problems that increase the chance of getting cervical cancer. In these cases, your health care provider may recommend more frequent screening and Pap tests.  Colorectal cancer can be detected and often prevented. Most routine colorectal cancer screening begins at the age of 50 years and continues through age 75 years. However, your health care provider may recommend screening at an earlier age if you have risk factors for colon cancer. On a yearly basis, your health care provider may provide home test kits to check  for hidden blood in the stool. Use of a small camera at the end of a tube, to directly examine the colon (sigmoidoscopy or colonoscopy), can detect the earliest forms of colorectal cancer. Talk to your health care provider about this at age 50, when routine screening begins. Direct exam of the colon should be repeated every 5-10 years through age 75 years, unless early forms of precancerous polyps or small growths are found.  People who are at an increased risk for hepatitis B should be screened for this virus. You are considered at high risk for hepatitis B if:  You were born in a country where hepatitis B occurs often. Talk with your health care provider about which countries are considered high risk.  Your parents were born in a high-risk country and you have not received a shot to protect against hepatitis B (hepatitis B vaccine).  You have HIV or AIDS.  You use needles to inject street drugs.  You live with, or have sex with, someone who has hepatitis B.  You get hemodialysis treatment.  You take certain medicines for conditions like cancer, organ transplantation, and autoimmune conditions.  Hepatitis C blood testing is recommended for all people born from 1945 through 1965 and any individual with known risks for hepatitis C.  Practice safe sex. Use condoms and avoid high-risk sexual practices to reduce the spread of sexually transmitted infections (STIs). STIs include gonorrhea, chlamydia, syphilis, trichomonas, herpes, HPV, and human immunodeficiency virus (HIV). Herpes, HIV, and HPV are viral illnesses that have no cure. They can result in disability, cancer, and death.  You should be screened for sexually transmitted illnesses (STIs) including gonorrhea and chlamydia if:  You are sexually active and are younger than 24 years.  You are older than 24 years and your health care provider tells you that you are at risk for this type of infection.  Your sexual activity has changed  since you were last screened and you are at an increased risk for chlamydia or gonorrhea. Ask your health care provider if you are at risk.  If you are at risk of being infected with HIV, it is recommended that you take a prescription medicine daily to prevent HIV infection. This is called preexposure prophylaxis (PrEP). You are considered at risk if:  You are sexually active and do not regularly use condoms or know the HIV status of your partner(s).  You take drugs by injection.  You are sexually active with a partner who has HIV.  Talk with your health care provider about whether you are at high risk of being infected with HIV. If   you choose to begin PrEP, you should first be tested for HIV. You should then be tested every 3 months for as long as you are taking PrEP.  Osteoporosis is a disease in which the bones lose minerals and strength with aging. This can result in serious bone fractures or breaks. The risk of osteoporosis can be identified using a bone density scan. Women ages 67 years and over and women at risk for fractures or osteoporosis should discuss screening with their health care providers. Ask your health care provider whether you should take a calcium supplement or vitamin D to reduce the rate of osteoporosis.  Menopause can be associated with physical symptoms and risks. Hormone replacement therapy is available to decrease symptoms and risks. You should talk to your health care provider about whether hormone replacement therapy is right for you.  Use sunscreen. Apply sunscreen liberally and repeatedly throughout the day. You should seek shade when your shadow is shorter than you. Protect yourself by wearing long sleeves, pants, a wide-brimmed hat, and sunglasses year round, whenever you are outdoors.  Once a month, do a whole body skin exam, using a mirror to look at the skin on your back. Tell your health care provider of new moles, moles that have irregular borders, moles that  are larger than a pencil eraser, or moles that have changed in shape or color.  Stay current with required vaccines (immunizations).  Influenza vaccine. All adults should be immunized every year.  Tetanus, diphtheria, and acellular pertussis (Td, Tdap) vaccine. Pregnant women should receive 1 dose of Tdap vaccine during each pregnancy. The dose should be obtained regardless of the length of time since the last dose. Immunization is preferred during the 27th-36th week of gestation. An adult who has not previously received Tdap or who does not know her vaccine status should receive 1 dose of Tdap. This initial dose should be followed by tetanus and diphtheria toxoids (Td) booster doses every 10 years. Adults with an unknown or incomplete history of completing a 3-dose immunization series with Td-containing vaccines should begin or complete a primary immunization series including a Tdap dose. Adults should receive a Td booster every 10 years.  Varicella vaccine. An adult without evidence of immunity to varicella should receive 2 doses or a second dose if she has previously received 1 dose. Pregnant females who do not have evidence of immunity should receive the first dose after pregnancy. This first dose should be obtained before leaving the health care facility. The second dose should be obtained 4-8 weeks after the first dose.  Human papillomavirus (HPV) vaccine. Females aged 13-26 years who have not received the vaccine previously should obtain the 3-dose series. The vaccine is not recommended for use in pregnant females. However, pregnancy testing is not needed before receiving a dose. If a female is found to be pregnant after receiving a dose, no treatment is needed. In that case, the remaining doses should be delayed until after the pregnancy. Immunization is recommended for any person with an immunocompromised condition through the age of 61 years if she did not get any or all doses earlier. During the  3-dose series, the second dose should be obtained 4-8 weeks after the first dose. The third dose should be obtained 24 weeks after the first dose and 16 weeks after the second dose.  Zoster vaccine. One dose is recommended for adults aged 30 years or older unless certain conditions are present.  Measles, mumps, and rubella (MMR) vaccine. Adults born  before 1957 generally are considered immune to measles and mumps. Adults born in 1957 or later should have 1 or more doses of MMR vaccine unless there is a contraindication to the vaccine or there is laboratory evidence of immunity to each of the three diseases. A routine second dose of MMR vaccine should be obtained at least 28 days after the first dose for students attending postsecondary schools, health care workers, or international travelers. People who received inactivated measles vaccine or an unknown type of measles vaccine during 1963-1967 should receive 2 doses of MMR vaccine. People who received inactivated mumps vaccine or an unknown type of mumps vaccine before 1979 and are at high risk for mumps infection should consider immunization with 2 doses of MMR vaccine. For females of childbearing age, rubella immunity should be determined. If there is no evidence of immunity, females who are not pregnant should be vaccinated. If there is no evidence of immunity, females who are pregnant should delay immunization until after pregnancy. Unvaccinated health care workers born before 1957 who lack laboratory evidence of measles, mumps, or rubella immunity or laboratory confirmation of disease should consider measles and mumps immunization with 2 doses of MMR vaccine or rubella immunization with 1 dose of MMR vaccine.  Pneumococcal 13-valent conjugate (PCV13) vaccine. When indicated, a person who is uncertain of his immunization history and has no record of immunization should receive the PCV13 vaccine. All adults 65 years of age and older should receive this  vaccine. An adult aged 19 years or older who has certain medical conditions and has not been previously immunized should receive 1 dose of PCV13 vaccine. This PCV13 should be followed with a dose of pneumococcal polysaccharide (PPSV23) vaccine. Adults who are at high risk for pneumococcal disease should obtain the PPSV23 vaccine at least 8 weeks after the dose of PCV13 vaccine. Adults older than 61 years of age who have normal immune system function should obtain the PPSV23 vaccine dose at least 1 year after the dose of PCV13 vaccine.  Pneumococcal polysaccharide (PPSV23) vaccine. When PCV13 is also indicated, PCV13 should be obtained first. All adults aged 65 years and older should be immunized. An adult younger than age 65 years who has certain medical conditions should be immunized. Any person who resides in a nursing home or long-term care facility should be immunized. An adult smoker should be immunized. People with an immunocompromised condition and certain other conditions should receive both PCV13 and PPSV23 vaccines. People with human immunodeficiency virus (HIV) infection should be immunized as soon as possible after diagnosis. Immunization during chemotherapy or radiation therapy should be avoided. Routine use of PPSV23 vaccine is not recommended for American Indians, [place] Natives, or people younger than 65 years unless there are medical conditions that require PPSV23 vaccine. When indicated, people who have unknown immunization and have no record of immunization should receive PPSV23 vaccine. One-time revaccination 5 years after the first dose of PPSV23 is recommended for people aged 19-64 years who have chronic kidney failure, nephrotic syndrome, asplenia, or immunocompromised conditions. People who received 1-2 doses of PPSV23 before age 65 years should receive another dose of PPSV23 vaccine at age 65 years or later if at least 5 years have passed since the previous dose. Doses of PPSV23 are not  needed for people immunized with PPSV23 at or after age 65 years.  Meningococcal vaccine. Adults with asplenia or persistent complement component deficiencies should receive 2 doses of quadrivalent meningococcal conjugate (MenACWY-D) vaccine. The doses should be obtained   at least 2 months apart. Microbiologists working with certain meningococcal bacteria, [place] recruits, people at risk during an outbreak, and people who travel to or live in countries with a high rate of meningitis should be immunized. A first-year college student up through age 34 years who is living in a residence hall should receive a dose if she did not receive a dose on or after her 16th birthday. Adults who have certain high-risk conditions should receive one or more doses of vaccine.  Hepatitis A vaccine. Adults who wish to be protected from this disease, have certain high-risk conditions, work with hepatitis A-infected animals, work in hepatitis A research labs, or travel to or work in countries with a high rate of hepatitis A should be immunized. Adults who were previously unvaccinated and who anticipate close contact with an international adoptee during the first 60 days after arrival in the [place] from a country with a high rate of hepatitis A should be immunized.  Hepatitis B vaccine. Adults who wish to be protected from this disease, have certain high-risk conditions, may be exposed to blood or other infectious body fluids, are household contacts or sex partners of hepatitis B positive people, are clients or workers in certain care facilities, or travel to or work in countries with a high rate of hepatitis B should be immunized.  Haemophilus influenzae type b (Hib) vaccine. A previously unvaccinated person with asplenia or sickle cell disease or having a scheduled splenectomy should receive 1 dose of Hib vaccine. Regardless of previous immunization, a recipient of a hematopoietic stem cell transplant should receive a  3-dose series 6-12 months after her successful transplant. Hib vaccine is not recommended for adults with HIV infection. Preventive Services / Frequency Ages 35 to 4 years  Blood pressure check.** / Every 3-5 years.  Lipid and cholesterol check.** / Every 5 years beginning at age 60.  Clinical breast exam.** / Every 3 years for women in their 71s and 10s.  BRCA-related cancer risk assessment.** / For women who have family members with a BRCA-related cancer (breast, ovarian, tubal, or peritoneal cancers).  Pap test.** / Every 2 years from ages 76 through 26. Every 3 years starting at age 61 through age 76 or 93 with a history of 3 consecutive normal Pap tests.  HPV screening.** / Every 3 years from ages 37 through ages 60 to 51 with a history of 3 consecutive normal Pap tests.  Hepatitis C blood test.** / For any individual with known risks for hepatitis C.  Skin self-exam. / Monthly.  Influenza vaccine. / Every year.  Tetanus, diphtheria, and acellular pertussis (Tdap, Td) vaccine.** / Consult your health care provider. Pregnant women should receive 1 dose of Tdap vaccine during each pregnancy. 1 dose of Td every 10 years.  Varicella vaccine.** / Consult your health care provider. Pregnant females who do not have evidence of immunity should receive the first dose after pregnancy.  HPV vaccine. / 3 doses over 6 months, if 93 and younger. The vaccine is not recommended for use in pregnant females. However, pregnancy testing is not needed before receiving a dose.  Measles, mumps, rubella (MMR) vaccine.** / You need at least 1 dose of MMR if you were born in 1957 or later. You may also need a 2nd dose. For females of childbearing age, rubella immunity should be determined. If there is no evidence of immunity, females who are not pregnant should be vaccinated. If there is no evidence of immunity, females who are  pregnant should delay immunization until after pregnancy.  Pneumococcal  13-valent conjugate (PCV13) vaccine.** / Consult your health care provider.  Pneumococcal polysaccharide (PPSV23) vaccine.** / 1 to 2 doses if you smoke cigarettes or if you have certain conditions.  Meningococcal vaccine.** / 1 dose if you are age 68 to 8 years and a Market researcher living in a residence hall, or have one of several medical conditions, you need to get vaccinated against meningococcal disease. You may also need additional booster doses.  Hepatitis A vaccine.** / Consult your health care provider.  Hepatitis B vaccine.** / Consult your health care provider.  Haemophilus influenzae type b (Hib) vaccine.** / Consult your health care provider. Ages 7 to 53 years  Blood pressure check.** / Every year.  Lipid and cholesterol check.** / Every 5 years beginning at age 25 years.  Lung cancer screening. / Every year if you are aged 11-80 years and have a 30-pack-year history of smoking and currently smoke or have quit within the past 15 years. Yearly screening is stopped once you have quit smoking for at least 15 years or develop a health problem that would prevent you from having lung cancer treatment.  Clinical breast exam.** / Every year after age 48 years.  BRCA-related cancer risk assessment.** / For women who have family members with a BRCA-related cancer (breast, ovarian, tubal, or peritoneal cancers).  Mammogram.** / Every year beginning at age 41 years and continuing for as long as you are in good health. Consult with your health care provider.  Pap test.** / Every 3 years starting at age 65 years through age 37 or 70 years with a history of 3 consecutive normal Pap tests.  HPV screening.** / Every 3 years from ages 72 years through ages 60 to 40 years with a history of 3 consecutive normal Pap tests.  Fecal occult blood test (FOBT) of stool. / Every year beginning at age 21 years and continuing until age 5 years. You may not need to do this test if you get  a colonoscopy every 10 years.  Flexible sigmoidoscopy or colonoscopy.** / Every 5 years for a flexible sigmoidoscopy or every 10 years for a colonoscopy beginning at age 35 years and continuing until age 48 years.  Hepatitis C blood test.** / For all people born from [birth year] through 1965 and any individual with known risks for hepatitis C.  Skin self-exam. / Monthly.  Influenza vaccine. / Every year.  Tetanus, diphtheria, and acellular pertussis (Tdap/Td) vaccine.** / Consult your health care provider. Pregnant women should receive 1 dose of Tdap vaccine during each pregnancy. 1 dose of Td every 10 years.  Varicella vaccine.** / Consult your health care provider. Pregnant females who do not have evidence of immunity should receive the first dose after pregnancy.  Zoster vaccine.** / 1 dose for adults aged 30 years or older.  Measles, mumps, rubella (MMR) vaccine.** / You need at least 1 dose of MMR if you were born in 1957 or later. You may also need a second dose. For females of childbearing age, rubella immunity should be determined. If there is no evidence of immunity, females who are not pregnant should be vaccinated. If there is no evidence of immunity, females who are pregnant should delay immunization until after pregnancy.  Pneumococcal 13-valent conjugate (PCV13) vaccine.** / Consult your health care provider.  Pneumococcal polysaccharide (PPSV23) vaccine.** / 1 to 2 doses if you smoke cigarettes or if you have certain conditions.  Meningococcal vaccine.** /  Consult your health care provider.  Hepatitis A vaccine.** / Consult your health care provider.  Hepatitis B vaccine.** / Consult your health care provider.  Haemophilus influenzae type b (Hib) vaccine.** / Consult your health care provider. Ages 64 years and over  Blood pressure check.** / Every year.  Lipid and cholesterol check.** / Every 5 years beginning at age 23 years.  Lung cancer screening. / Every year if you  are aged 16-80 years and have a 30-pack-year history of smoking and currently smoke or have quit within the past 15 years. Yearly screening is stopped once you have quit smoking for at least 15 years or develop a health problem that would prevent you from having lung cancer treatment.  Clinical breast exam.** / Every year after age 74 years.  BRCA-related cancer risk assessment.** / For women who have family members with a BRCA-related cancer (breast, ovarian, tubal, or peritoneal cancers).  Mammogram.** / Every year beginning at age 44 years and continuing for as long as you are in good health. Consult with your health care provider.  Pap test.** / Every 3 years starting at age 58 years through age 22 or 39 years with 3 consecutive normal Pap tests. Testing can be stopped between 65 and 70 years with 3 consecutive normal Pap tests and no abnormal Pap or HPV tests in the past 10 years.  HPV screening.** / Every 3 years from ages 64 years through ages 70 or 61 years with a history of 3 consecutive normal Pap tests. Testing can be stopped between 65 and 70 years with 3 consecutive normal Pap tests and no abnormal Pap or HPV tests in the past 10 years.  Fecal occult blood test (FOBT) of stool. / Every year beginning at age 40 years and continuing until age 27 years. You may not need to do this test if you get a colonoscopy every 10 years.  Flexible sigmoidoscopy or colonoscopy.** / Every 5 years for a flexible sigmoidoscopy or every 10 years for a colonoscopy beginning at age 7 years and continuing until age 32 years.  Hepatitis C blood test.** / For all people born from [birth year] through 1965 and any individual with known risks for hepatitis C.  Osteoporosis screening.** / A one-time screening for women ages 30 years and over and women at risk for fractures or osteoporosis.  Skin self-exam. / Monthly.  Influenza vaccine. / Every year.  Tetanus, diphtheria, and acellular pertussis (Tdap/Td)  vaccine.** / 1 dose of Td every 10 years.  Varicella vaccine.** / Consult your health care provider.  Zoster vaccine.** / 1 dose for adults aged 35 years or older.  Pneumococcal 13-valent conjugate (PCV13) vaccine.** / Consult your health care provider.  Pneumococcal polysaccharide (PPSV23) vaccine.** / 1 dose for all adults aged 46 years and older.  Meningococcal vaccine.** / Consult your health care provider.  Hepatitis A vaccine.** / Consult your health care provider.  Hepatitis B vaccine.** / Consult your health care provider.  Haemophilus influenzae type b (Hib) vaccine.** / Consult your health care provider. ** Family history and personal history of risk and conditions may change your health care provider's recommendations.   This information is not intended to replace advice given to you by your health care provider. Make sure you discuss any questions you have with your health care provider.   Document Released: 03/06/2001 Document Revised: 01/29/2014 Document Reviewed: 06/05/2010 Elsevier Interactive Patient Education [employer].

## 2015-03-03 NOTE — Progress Notes (Signed)
Pre visit review using our clinic review tool, if applicable. No additional management support is needed unless otherwise documented below in the visit note. 

## 2015-03-04 ENCOUNTER — Encounter: Payer: Self-pay | Admitting: Internal Medicine

## 2015-03-04 LAB — HEPATITIS C ANTIBODY: HCV AB: NEGATIVE

## 2015-03-04 LAB — HIV ANTIBODY (ROUTINE TESTING W REFLEX): HIV: NONREACTIVE

## 2015-03-04 LAB — LIPID PANEL
CHOL/HDL RATIO: 3
Cholesterol: 230 mg/dL — ABNORMAL HIGH (ref 0–200)
HDL: 82 mg/dL (ref >39.00)
LDL Cholesterol: 135 mg/dL — ABNORMAL HIGH (ref 0–99)
NonHDL: 148.25
TRIGLYCERIDES: 68 mg/dL (ref 0.0–149.0)
VLDL: 13.6 mg/dL (ref 0.0–40.0)

## 2015-03-04 LAB — COMPREHENSIVE METABOLIC PANEL
ALT: 26 U/L (ref 0–35)
AST: 21 U/L (ref 0–37)
Albumin: 4.4 g/dL (ref 3.5–5.2)
Alkaline Phosphatase: 39 U/L (ref 39–117)
BUN: 18 mg/dL (ref 6–23)
CHLORIDE: 105 meq/L (ref 96–112)
CO2: 31 mEq/L (ref 19–32)
Calcium: 9.5 mg/dL (ref 8.4–10.5)
Creatinine, Ser: 0.67 mg/dL (ref 0.40–1.20)
GFR: 95.09 mL/min (ref >60.00)
GLUCOSE: 84 mg/dL (ref 70–99)
POTASSIUM: 3.9 meq/L (ref 3.5–5.1)
SODIUM: 142 meq/L (ref 135–145)
Total Bilirubin: 0.5 mg/dL (ref 0.2–1.2)
Total Protein: 7.2 g/dL (ref 6.0–8.3)

## 2015-03-04 LAB — TSH: TSH: 1.34 u[IU]/mL (ref 0.35–4.50)

## 2015-03-04 NOTE — Addendum Note (Signed)
Addended by: Ewing Schlein on: 03/04/2015 08:08 AM   Modules accepted: Orders

## 2015-03-06 LAB — CBC WITH DIFFERENTIAL/PLATELET
BASOS ABS: 0.1 10*3/uL (ref 0.0–0.1)
BASOS PCT: 1.1 % (ref 0.0–3.0)
EOS ABS: 0.1 10*3/uL (ref 0.0–0.7)
Eosinophils Relative: 2 % (ref 0.0–5.0)
HEMATOCRIT: 45.8 % (ref 36.0–46.0)
Hemoglobin: 14.2 g/dL (ref 12.0–15.0)
LYMPHS ABS: 1.1 10*3/uL (ref 0.7–4.0)
LYMPHS PCT: 24.8 % (ref 12.0–46.0)
MCHC: 31 g/dL (ref 30.0–36.0)
MCV: 106.5 fl — ABNORMAL HIGH (ref 78.0–100.0)
MONO ABS: 0.5 10*3/uL (ref 0.1–1.0)
Monocytes Relative: 10 % (ref 3.0–12.0)
NEUTROS ABS: 2.8 10*3/uL (ref 1.4–7.7)
NEUTROS PCT: 62.1 % (ref 43.0–77.0)
PLATELETS: 227 10*3/uL (ref 150.0–400.0)
RBC: 4.3 Mil/uL (ref 3.87–5.11)
RDW: 13.6 % (ref 11.5–15.5)
WBC: 4.5 10*3/uL (ref 4.0–10.5)

## 2015-03-09 NOTE — Progress Notes (Signed)
Quick Note:  Pt has seen results on MyChart and message also sent for patient to call back if any questions. ______ 

## 2015-04-14 ENCOUNTER — Ambulatory Visit (AMBULATORY_SURGERY_CENTER): Payer: Self-pay

## 2015-04-14 VITALS — Ht 63.0 in | Wt 100.0 lb

## 2015-04-14 DIAGNOSIS — Z1211 Encounter for screening for malignant neoplasm of colon: Secondary | ICD-10-CM

## 2015-04-14 MED ORDER — NA SULFATE-K SULFATE-MG SULF 17.5-3.13-1.6 GM/177ML PO SOLN: 1.0000 | Freq: Once | ORAL | Status: DC

## 2015-04-14 NOTE — Progress Notes (Signed)
No egg or soy allergies Not on home 02 No previous anesthesia complications Emmi video emailed to home email address. No diet or weight loss meds 

## 2015-04-28 ENCOUNTER — Encounter: Payer: Self-pay | Admitting: Internal Medicine

## 2015-04-28 ENCOUNTER — Ambulatory Visit (AMBULATORY_SURGERY_CENTER): Payer: BLUE CROSS/BLUE SHIELD | Admitting: Internal Medicine

## 2015-04-28 VITALS — BP 90/61 | HR 71 | Temp 96.4°F | Resp 12 | Ht 63.0 in | Wt 100.0 lb

## 2015-04-28 DIAGNOSIS — D123 Benign neoplasm of transverse colon: Secondary | ICD-10-CM | POA: Diagnosis not present

## 2015-04-28 DIAGNOSIS — Z1211 Encounter for screening for malignant neoplasm of colon: Secondary | ICD-10-CM

## 2015-04-28 MED ORDER — SODIUM CHLORIDE 0.9 % IV SOLN: 500.0000 mL | INTRAVENOUS | Status: DC

## 2015-04-28 NOTE — Progress Notes (Signed)
Patient awakening,vss,report to rn 

## 2015-04-28 NOTE — Op Note (Signed)
[facility] Patient Name: [name] Procedure Date: 04/28/2015 7:59 AM MRN: [id number] Endoscopist: Jerene Bears , MD Age: 60 Date of Birth: [birth year] Gender: Female Procedure:                Colonoscopy Indications:              Screening for colorectal malignant neoplasm, This                            is the patient's first colonoscopy Medicines:                Monitored Anesthesia Care Procedure:                Pre-Anesthesia Assessment:                           - Prior to the procedure, a History and Physical                            was performed, and patient medications and                            allergies were reviewed. The patient's tolerance of                            previous anesthesia was also reviewed. The risks                            and benefits of the procedure and the sedation                            options and risks were discussed with the patient.                            All questions were answered, and informed consent                            was obtained. Prior Anticoagulants: The patient has                            taken no previous anticoagulant or antiplatelet                            agents. ASA Grade Assessment: I - A normal, healthy                            patient. After reviewing the risks and benefits,                            the patient was deemed in satisfactory condition to                            undergo the procedure.  After obtaining informed consent, the colonoscope                            was passed under direct vision. Throughout the                            procedure, the patient's blood pressure, pulse, and                            oxygen saturations were monitored continuously. The                            Model PCF-H190L [id number]) scope was introduced                            through the anus and advanced to the the cecum,   identified by appendiceal orifice and ileocecal                            valve. The colonoscopy was performed without                            difficulty. The patient tolerated the procedure                            well. The quality of the bowel preparation was                            good. The ileocecal valve, appendiceal orifice, and                            rectum were photographed. Scope In: 8:05:21 AM Scope Out: 8:25:25 AM Scope Withdrawal Time: 0 hours 11 minutes 43 seconds  Total Procedure Duration: 0 hours 20 minutes 4 seconds  Findings:                 The perianal exam findings include skin tags.                           A 6 mm polyp was found in the transverse colon. The                            polyp was sessile. The polyp was removed with a                            cold snare. Resection and retrieval were complete.                           The exam was otherwise without abnormality.                           Internal hemorrhoids and hypertrophied anal                            papillae were  found during retroflexion. The                            hemorrhoids were small. Complications:            No immediate complications. Estimated Blood Loss:     Estimated blood loss: none. Impression:               - One 6 mm polyp in the transverse colon, removed                            with a cold snare. Resected and retrieved.                           - The examination was otherwise normal.                           - Internal hemorrhoids and hypertrophied anal                            papillae. Recommendation:           - Patient has a contact number available for                            emergencies. The signs and symptoms of potential                            delayed complications were discussed with the                            patient. Return to normal activities tomorrow.                            Written discharge instructions were provided to  the                            patient.                           - Resume previous diet.                           - Continue present medications.                           - Await pathology results.                           - Repeat colonoscopy is recommended for                            surveillance. The colonoscopy date will be                            determined after pathology results from today's  exam become available for review. Jerene Bears, MD 04/28/2015 8:29:52 AM This report has been signed electronically.

## 2015-04-28 NOTE — Patient Instructions (Signed)
YOU HAD AN ENDOSCOPIC PROCEDURE TODAY AT THE [facility]:   Refer to the procedure report that was given to you for any specific questions about what was found during the examination.  If the procedure report does not answer your questions, please call your gastroenterologist to clarify.  If you requested that your care partner not be given the details of your procedure findings, then the procedure report has been included in a sealed envelope for you to review at your convenience later.  YOU SHOULD EXPECT: Some feelings of bloating in the abdomen. Passage of more gas than usual.  Walking can help get rid of the air that was put into your GI tract during the procedure and reduce the bloating. If you had a lower endoscopy (such as a colonoscopy or flexible sigmoidoscopy) you may notice spotting of blood in your stool or on the toilet paper. If you underwent a bowel prep for your procedure, you may not have a normal bowel movement for a few days.  Please Note:  You might notice some irritation and congestion in your nose or some drainage.  This is from the oxygen used during your procedure.  There is no need for concern and it should clear up in a day or so.  SYMPTOMS TO REPORT IMMEDIATELY:   Following lower endoscopy (colonoscopy or flexible sigmoidoscopy):  Excessive amounts of blood in the stool  Significant tenderness or worsening of abdominal pains  Swelling of the abdomen that is new, acute  Fever of 100F or higher   For urgent or emergent issues, a gastroenterologist can be reached at any hour by calling [phone].   DIET: Your first meal following the procedure should be a small meal and then it is ok to progress to your normal diet. Heavy or fried foods are harder to digest and may make you feel nauseous or bloated.  Likewise, meals heavy in dairy and vegetables can increase bloating.  Drink plenty of fluids but you should avoid alcoholic beverages for 24  hours.  ACTIVITY:  You should plan to take it easy for the rest of today and you should NOT DRIVE or use heavy machinery until tomorrow (because of the sedation medicines used during the test).    FOLLOW UP: Our staff will call the number listed on your records the next business day following your procedure to check on you and address any questions or concerns that you may have regarding the information given to you following your procedure. If we do not reach you, we will leave a message.  However, if you are feeling well and you are not experiencing any problems, there is no need to return our call.  We will assume that you have returned to your regular daily activities without incident.  If any biopsies were taken you will be contacted by phone or by letter within the next 1-3 weeks.  Please call us at [phone] if you have not heard about the biopsies in 3 weeks.    SIGNATURES/CONFIDENTIALITY: You and/or your care partner have signed paperwork which will be entered into your electronic medical record.  These signatures attest to the fact that that the information above on your After Visit Summary has been reviewed and is understood.  Full responsibility of the confidentiality of this discharge information lies with you and/or your care-partner. 

## 2015-04-28 NOTE — Progress Notes (Signed)
Called to room to assist during endoscopic procedure.  Patient ID and intended procedure confirmed with present staff. Received instructions for my participation in the procedure from the performing physician.  

## 2015-04-29 ENCOUNTER — Telehealth: Payer: Self-pay

## 2015-04-29 NOTE — Telephone Encounter (Signed)
  Follow up Call-  Call back number 04/28/2015  Post procedure Call Back phone  # [phone]  Permission to leave phone message Yes     Patient questions:  Do you have a fever, pain , or abdominal swelling? No. Pain Score  0 *  Have you tolerated food without any problems? Yes.    Have you been able to return to your normal activities? Yes.    Do you have any questions about your discharge instructions: Diet   No. Medications  No. Follow up visit  No.  Do you have questions or concerns about your Care? No.  Actions: * If pain score is 4 or above: No action needed, pain <4.

## 2015-05-09 ENCOUNTER — Telehealth: Payer: Self-pay

## 2015-05-09 ENCOUNTER — Encounter: Payer: Self-pay | Admitting: Internal Medicine

## 2015-05-09 DIAGNOSIS — Z1231 Encounter for screening mammogram for malignant neoplasm of breast: Secondary | ICD-10-CM

## 2015-05-09 NOTE — Telephone Encounter (Signed)
Patient called she is coming in May 25th for a pap and would like to have an order placed for her mammogram to be completed downstairs. Please advise.

## 2015-05-09 NOTE — Telephone Encounter (Signed)
The order is in.       [place]

## 2015-05-12 ENCOUNTER — Ambulatory Visit (HOSPITAL_BASED_OUTPATIENT_CLINIC_OR_DEPARTMENT_OTHER): Payer: BLUE CROSS/BLUE SHIELD

## 2015-06-10 ENCOUNTER — Encounter: Payer: Self-pay | Admitting: Family Medicine

## 2015-06-10 DIAGNOSIS — E785 Hyperlipidemia, unspecified: Secondary | ICD-10-CM

## 2015-06-13 NOTE — Telephone Encounter (Signed)
Orders entered, email sent to pt.

## 2015-06-13 NOTE — Telephone Encounter (Signed)
Just need cmp and lipid  Dx hyperlipidemia

## 2015-06-13 NOTE — Telephone Encounter (Signed)
Dr Carollee Herter-- do you want same as previous labs drawn? (CMP, CBC w/diff, lipid panel, TSH)

## 2015-06-16 ENCOUNTER — Encounter: Payer: Self-pay | Admitting: Family Medicine

## 2015-06-16 ENCOUNTER — Other Ambulatory Visit (HOSPITAL_COMMUNITY)
Admission: RE | Admit: 2015-06-16 | Discharge: 2015-06-16 | Disposition: A | Discharge status: Home / Self Care | Payer: BLUE CROSS/BLUE SHIELD | Source: Ambulatory Visit | Attending: Family Medicine | Admitting: Family Medicine

## 2015-06-16 ENCOUNTER — Ambulatory Visit (INDEPENDENT_AMBULATORY_CARE_PROVIDER_SITE_OTHER): Payer: BLUE CROSS/BLUE SHIELD | Admitting: Family Medicine

## 2015-06-16 ENCOUNTER — Ambulatory Visit (HOSPITAL_BASED_OUTPATIENT_CLINIC_OR_DEPARTMENT_OTHER)
Admission: RE | Admit: 2015-06-16 | Discharge: 2015-06-16 | Disposition: A | Discharge status: Home / Self Care | Payer: BLUE CROSS/BLUE SHIELD | Source: Ambulatory Visit | Attending: Family Medicine | Admitting: Family Medicine

## 2015-06-16 VITALS — BP 98/60 | HR 70 | Temp 97.6°F | Ht 63.0 in | Wt 99.2 lb

## 2015-06-16 DIAGNOSIS — Z1231 Encounter for screening mammogram for malignant neoplasm of breast: Secondary | ICD-10-CM

## 2015-06-16 DIAGNOSIS — Z01419 Encounter for gynecological examination (general) (routine) without abnormal findings: Secondary | ICD-10-CM | POA: Insufficient documentation

## 2015-06-16 DIAGNOSIS — Z1151 Encounter for screening for human papillomavirus (HPV): Secondary | ICD-10-CM | POA: Insufficient documentation

## 2015-06-16 NOTE — Progress Notes (Signed)
Patient ID: [name], female    DOB: 12/03/1955  Age: 60 y.o. MRN: [id number]    Subjective:  Subjective HPI Carleena B Wiederkehr presents for pap only.  No complaints.    Review of Systems  Constitutional: Negative for diaphoresis, appetite change, fatigue and unexpected weight change.  Eyes: Negative for pain, redness and visual disturbance.  Respiratory: Negative for cough, chest tightness, shortness of breath and wheezing.   Cardiovascular: Negative for chest pain, palpitations and leg swelling.  Endocrine: Negative for cold intolerance, heat intolerance, polydipsia, polyphagia and polyuria.  Genitourinary: Negative for dysuria, frequency and difficulty urinating.  Neurological: Negative for dizziness, light-headedness, numbness and headaches.    History History reviewed. No pertinent past medical history.  She has past surgical history that includes Appendectomy (2012).   Her family history includes Heart disease in her father; Stroke in her mother. There is no history of Colon cancer, Esophageal cancer, Rectal cancer, or Stomach cancer.She reports that she has never smoked. She has never used smokeless tobacco. She reports that she drinks about 1.8 oz of alcohol per week. She reports that she does not use illicit drugs.  Current Outpatient Prescriptions on File Prior to Visit  Medication Sig Dispense Refill  . aspirin 81 MG tablet Take 81 mg by mouth daily.    . Multiple Vitamins-Minerals (MULTIVITAMIN WITH MINERALS) tablet Take 1 tablet by mouth daily.     No current facility-administered medications on file prior to visit.     Objective:  Objective Physical Exam  Constitutional: She is oriented to person, place, and time. She appears well-developed and well-nourished.  HENT:  Head: Normocephalic and atraumatic.  Eyes: Conjunctivae and EOM are normal.  Neck: Normal range of motion. Neck supple. No JVD present. Carotid bruit is not present. No thyromegaly present.    Cardiovascular: Normal rate, regular rhythm and normal heart sounds.   No murmur heard. Pulmonary/Chest: Effort normal and breath sounds normal. No respiratory distress. She has no wheezes. She has no rales. She exhibits no tenderness.  Genitourinary: Vagina normal and uterus normal. Guaiac negative stool. No breast swelling, tenderness, discharge or bleeding. Pelvic exam was performed with patient supine. No labial fusion. There is no rash, tenderness, lesion or injury on the right labia. There is no rash, tenderness, lesion or injury on the left labia. No vaginal discharge found.  Musculoskeletal: She exhibits no edema.  Neurological: She is alert and oriented to person, place, and time.  Psychiatric: She has a normal mood and affect.  Nursing note and vitals reviewed.  BP 98/60 mmHg  Pulse 70  Temp(Src) 97.6 F (36.4 C) (Oral)  Ht 5\' 3"  (1.6 m)  Wt 99 lb 3.2 oz (44.997 kg)  BMI 17.58 kg/m2  SpO2 99% Wt Readings from Last 3 Encounters:  06/16/15 99 lb 3.2 oz (44.997 kg)  04/28/15 100 lb (45.36 kg)  04/14/15 100 lb (45.36 kg)     Lab Results  Component Value Date   WBC 4.5 03/03/2015   HGB 14.2 03/03/2015   HCT 45.8 03/03/2015   PLT 227.0 03/03/2015   GLUCOSE 86 06/17/2015   CHOL 228* 06/17/2015   TRIG 63.0 06/17/2015   HDL 69.90 06/17/2015   LDLCALC 145* 06/17/2015   ALT 24 06/17/2015   AST 19 06/17/2015   NA 142 06/17/2015   K 3.9 06/17/2015   CL 104 06/17/2015   CREATININE 0.77 06/17/2015   BUN 14 06/17/2015   CO2 33* 06/17/2015   TSH 1.34 03/03/2015  No results found.   Assessment & Plan:  Plan I am having Ms. Vandenboom maintain her aspirin and multivitamin with minerals.  No orders of the defined types were placed in this encounter.    Problem List Items Addressed This Visit    None    Visit Diagnoses    Encounter for gynecological examination    -  Primary    Relevant Orders    Cytology - PAP (Completed)       Follow-up: Return in about 1 year  (around 06/15/2016), or if symptoms worsen or fail to improve, for annual exam, fasting.  Ann Held, DO

## 2015-06-16 NOTE — Progress Notes (Signed)
Pre visit review using our clinic review tool, if applicable. No additional management support is needed unless otherwise documented below in the visit note. 

## 2015-06-17 ENCOUNTER — Other Ambulatory Visit (INDEPENDENT_AMBULATORY_CARE_PROVIDER_SITE_OTHER): Payer: BLUE CROSS/BLUE SHIELD

## 2015-06-17 DIAGNOSIS — E785 Hyperlipidemia, unspecified: Secondary | ICD-10-CM | POA: Diagnosis not present

## 2015-06-17 LAB — COMPREHENSIVE METABOLIC PANEL
ALK PHOS: 38 U/L — AB (ref 39–117)
ALT: 24 U/L (ref 0–35)
AST: 19 U/L (ref 0–37)
Albumin: 4.3 g/dL (ref 3.5–5.2)
BUN: 14 mg/dL (ref 6–23)
CO2: 33 meq/L — AB (ref 19–32)
Calcium: 9.4 mg/dL (ref 8.4–10.5)
Chloride: 104 mEq/L (ref 96–112)
Creatinine, Ser: 0.77 mg/dL (ref 0.40–1.20)
GFR: 80.91 mL/min (ref >60.00)
GLUCOSE: 86 mg/dL (ref 70–99)
POTASSIUM: 3.9 meq/L (ref 3.5–5.1)
SODIUM: 142 meq/L (ref 135–145)
TOTAL PROTEIN: 6.7 g/dL (ref 6.0–8.3)
Total Bilirubin: 0.7 mg/dL (ref 0.2–1.2)

## 2015-06-17 LAB — LIPID PANEL
CHOL/HDL RATIO: 3
Cholesterol: 228 mg/dL — ABNORMAL HIGH (ref 0–200)
HDL: 69.9 mg/dL (ref >39.00)
LDL Cholesterol: 145 mg/dL — ABNORMAL HIGH (ref 0–99)
NONHDL: 157.73
Triglycerides: 63 mg/dL (ref 0.0–149.0)
VLDL: 12.6 mg/dL (ref 0.0–40.0)

## 2015-06-21 LAB — CYTOLOGY - PAP

## 2015-06-21 NOTE — Addendum Note (Signed)
Addended byDamita Dunnings D on: 06/21/2015 09:41 AM   Modules accepted: Orders

## 2015-06-24 ENCOUNTER — Encounter: Payer: Self-pay | Admitting: Family Medicine

## 2015-06-24 NOTE — Telephone Encounter (Signed)
Relationship to patient: self Can be reached: [phone]  Reason for call: Pt called to f/u on UA results and mammogram results. Pt would like call today if possible.

## 2015-06-27 ENCOUNTER — Encounter: Payer: Self-pay | Admitting: Family Medicine

## 2015-06-27 NOTE — Telephone Encounter (Signed)
Thought I answered it last week.  UA was not ordered this last ov--- because it was done in February Mammogram--- it says she was sent a letter-- we do not call about mammograms when letters are sent out

## 2015-06-27 NOTE — Telephone Encounter (Signed)
Duplicate. There is another message where this was handled.    KP

## 2015-07-07 DIAGNOSIS — Z86018 Personal history of other benign neoplasm: Secondary | ICD-10-CM | POA: Diagnosis not present

## 2015-07-07 DIAGNOSIS — Q825 Congenital non-neoplastic nevus: Secondary | ICD-10-CM | POA: Diagnosis not present

## 2015-07-07 DIAGNOSIS — D225 Melanocytic nevi of trunk: Secondary | ICD-10-CM | POA: Diagnosis not present

## 2015-07-07 DIAGNOSIS — Z85828 Personal history of other malignant neoplasm of skin: Secondary | ICD-10-CM | POA: Diagnosis not present

## 2016-03-05 ENCOUNTER — Ambulatory Visit (INDEPENDENT_AMBULATORY_CARE_PROVIDER_SITE_OTHER): Payer: BLUE CROSS/BLUE SHIELD | Admitting: Family Medicine

## 2016-03-05 ENCOUNTER — Encounter: Payer: Self-pay | Admitting: Family Medicine

## 2016-03-05 VITALS — BP 116/78 | HR 75 | Temp 97.7°F | Resp 16 | Ht 62.8 in | Wt 101.6 lb

## 2016-03-05 DIAGNOSIS — Z0000 Encounter for general adult medical examination without abnormal findings: Secondary | ICD-10-CM

## 2016-03-05 DIAGNOSIS — Z1239 Encounter for other screening for malignant neoplasm of breast: Secondary | ICD-10-CM

## 2016-03-05 DIAGNOSIS — Z1231 Encounter for screening mammogram for malignant neoplasm of breast: Secondary | ICD-10-CM | POA: Diagnosis not present

## 2016-03-05 DIAGNOSIS — E2839 Other primary ovarian failure: Secondary | ICD-10-CM

## 2016-03-05 NOTE — Progress Notes (Signed)
Subjective:     [name] is a 61 y.o. female and is here for a comprehensive physical exam. The patient reports no problems.  Social History   Social History  . Marital status: Married    Spouse name: N/A  . Number of children: N/A  . Years of education: N/A   Occupational History  . Not on file.   Social History Main Topics  . Smoking status: Never Smoker  . Smokeless tobacco: Never Used  . Alcohol use 1.8 oz/week    3 Glasses of wine per week     Comment: wine daily  . Drug use: No  . Sexual activity: Yes    Birth control/ protection: Post-menopausal   Other Topics Concern  . Not on file   Social History Narrative   Exercise 5 days a week   Live at home with husband      Health Maintenance  Topic Date Due  . MAMMOGRAM  06/15/2017  . PAP SMEAR  06/16/2018  . COLONOSCOPY  04/27/2020  . TETANUS/TDAP  03/02/2025  . INFLUENZA VACCINE  Completed  . ZOSTAVAX  Completed  . Hepatitis C Screening  Completed  . HIV Screening  Completed    The following portions of the patient's history were reviewed and updated as appropriate:  She  has no past medical history on file. She  does not have any pertinent problems on file. She  has a past surgical history that includes Appendectomy (2012). Her family history includes Heart disease in her father; Stroke in her mother. She  reports that she has never smoked. She has never used smokeless tobacco. She reports that she drinks about 1.8 oz of alcohol per week . She reports that she does not use drugs. She has a current medication list which includes the following prescription(s): aspirin and multivitamin with minerals. Current Outpatient Prescriptions on File Prior to Visit  Medication Sig Dispense Refill  . aspirin 81 MG tablet Take 81 mg by mouth daily.    . Multiple Vitamins-Minerals (MULTIVITAMIN WITH MINERALS) tablet Take 1 tablet by mouth daily.     No current facility-administered medications on file prior to visit.     She is allergic to duricef [cefadroxil monohydrate]..  Review of Systems Review of Systems  Constitutional: Negative for activity change, appetite change and fatigue.  HENT: Negative for hearing loss, congestion, tinnitus and ear discharge.  dentist q18m Eyes: Negative for visual disturbance (see optho q1y -- vision corrected to 20/20 with glasses).  Respiratory: Negative for cough, chest tightness and shortness of breath.   Cardiovascular: Negative for chest pain, palpitations and leg swelling.  Gastrointestinal: Negative for abdominal pain, diarrhea, constipation and abdominal distention.  Genitourinary: Negative for urgency, frequency, decreased urine volume and difficulty urinating.  Musculoskeletal: Negative for back pain, arthralgias and gait problem.  Skin: Negative for color change, pallor and rash.  Neurological: Negative for dizziness, light-headedness, numbness and headaches.  Hematological: Negative for adenopathy. Does not bruise/bleed easily.  Psychiatric/Behavioral: Negative for suicidal ideas, confusion, sleep disturbance, self-injury, dysphoric mood, decreased concentration and agitation.       Objective:    BP 116/78 (BP Location: Left Arm, Cuff Size: Normal)   Pulse 75   Temp 97.7 F (36.5 C) (Oral)   Resp 16   Ht 5' 2.8" (1.595 m)   Wt 101 lb 9.6 oz (46.1 kg)   SpO2 97%   BMI 18.11 kg/m  General appearance: alert, cooperative, appears stated age and no distress Head: Normocephalic,  without obvious abnormality, atraumatic Eyes: conjunctivae/corneas clear. PERRL, EOM's intact. Fundi benign. Ears: normal TM's and external ear canals both ears Nose: Nares normal. Septum midline. Mucosa normal. No drainage or sinus tenderness. Throat: lips, mucosa, and tongue normal; teeth and gums normal Neck: no adenopathy, no carotid bruit, no JVD, supple, symmetrical, trachea midline and thyroid not enlarged, symmetric, no tenderness/mass/nodules Back: symmetric, no  curvature. ROM normal. No CVA tenderness. Lungs: clear to auscultation bilaterally Breasts: normal appearance, no masses or tenderness Heart: regular rate and rhythm, S1, S2 normal, no murmur, click, rub or gallop Abdomen: soft, non-tender; bowel sounds normal; no masses,  no organomegaly Pelvic: deferred Extremities: extremities normal, atraumatic, no cyanosis or edema Pulses: 2+ and symmetric Skin: Skin color, texture, turgor normal. No rashes or lesions Lymph nodes: Cervical, supraclavicular, and axillary nodes normal. Neurologic: Alert and oriented X 3, normal strength and tone. Normal symmetric reflexes. Normal coordination and gait    Assessment:    Healthy female exam.      Plan:    ghm utd Check labs See After Visit Summary for Counseling Recommendations    1. Preventative health care Check  labs See AVS - CBC with Differential/Platelet; Future - Comprehensive metabolic panel; Future - Lipid panel; Future - POCT urinalysis dipstick; Future - TSH; Future  2. Estrogen deficiency   - DG Bone Density; Future  3. Breast cancer screening   - MM SCREENING BREAST TOMO BILATERAL; Future

## 2016-03-05 NOTE — Patient Instructions (Signed)

## 2016-03-05 NOTE — Progress Notes (Signed)
Pre visit review using our clinic review tool, if applicable. No additional management support is needed unless otherwise documented below in the visit note. 

## 2016-03-06 ENCOUNTER — Other Ambulatory Visit (INDEPENDENT_AMBULATORY_CARE_PROVIDER_SITE_OTHER): Payer: BLUE CROSS/BLUE SHIELD

## 2016-03-06 DIAGNOSIS — Z0000 Encounter for general adult medical examination without abnormal findings: Secondary | ICD-10-CM

## 2016-03-06 LAB — CBC WITH DIFFERENTIAL/PLATELET
BASOS ABS: 0.1 10*3/uL (ref 0.0–0.1)
Basophils Relative: 1.5 % (ref 0.0–3.0)
EOS ABS: 0.2 10*3/uL (ref 0.0–0.7)
Eosinophils Relative: 5.2 % — ABNORMAL HIGH (ref 0.0–5.0)
HEMATOCRIT: 43.6 % (ref 36.0–46.0)
HEMOGLOBIN: 14.8 g/dL (ref 12.0–15.0)
LYMPHS PCT: 31.5 % (ref 12.0–46.0)
Lymphs Abs: 1.1 10*3/uL (ref 0.7–4.0)
MCHC: 33.9 g/dL (ref 30.0–36.0)
MCV: 98.2 fl (ref 78.0–100.0)
MONO ABS: 0.3 10*3/uL (ref 0.1–1.0)
Monocytes Relative: 9.3 % (ref 3.0–12.0)
Neutro Abs: 1.9 10*3/uL (ref 1.4–7.7)
Neutrophils Relative %: 52.5 % (ref 43.0–77.0)
Platelets: 222 10*3/uL (ref 150.0–400.0)
RBC: 4.44 Mil/uL (ref 3.87–5.11)
RDW: 12.8 % (ref 11.5–15.5)
WBC: 3.5 10*3/uL — ABNORMAL LOW (ref 4.0–10.5)

## 2016-03-06 LAB — LIPID PANEL
Cholesterol: 248 mg/dL — ABNORMAL HIGH (ref 0–200)
HDL: 85.9 mg/dL (ref >39.00)
LDL Cholesterol: 152 mg/dL — ABNORMAL HIGH (ref 0–99)
NonHDL: 162.06
Total CHOL/HDL Ratio: 3
Triglycerides: 50 mg/dL (ref 0.0–149.0)
VLDL: 10 mg/dL (ref 0.0–40.0)

## 2016-03-06 LAB — COMPREHENSIVE METABOLIC PANEL
ALT: 22 U/L (ref 0–35)
AST: 25 U/L (ref 0–37)
Albumin: 4.3 g/dL (ref 3.5–5.2)
Alkaline Phosphatase: 40 U/L (ref 39–117)
BUN: 13 mg/dL (ref 6–23)
CO2: 30 mEq/L (ref 19–32)
Calcium: 9.4 mg/dL (ref 8.4–10.5)
Chloride: 104 mEq/L (ref 96–112)
Creatinine, Ser: 0.68 mg/dL (ref 0.40–1.20)
GFR: 93.16 mL/min (ref >60.00)
Glucose, Bld: 88 mg/dL (ref 70–99)
Potassium: 3.8 mEq/L (ref 3.5–5.1)
Sodium: 141 mEq/L (ref 135–145)
Total Bilirubin: 0.5 mg/dL (ref 0.2–1.2)
Total Protein: 7 g/dL (ref 6.0–8.3)

## 2016-03-06 LAB — TSH: TSH: 3.2 u[IU]/mL (ref 0.35–4.50)

## 2016-03-12 ENCOUNTER — Other Ambulatory Visit: Payer: Self-pay | Admitting: Family Medicine

## 2016-03-12 DIAGNOSIS — E785 Hyperlipidemia, unspecified: Secondary | ICD-10-CM

## 2016-03-14 DIAGNOSIS — H52221 Regular astigmatism, right eye: Secondary | ICD-10-CM | POA: Diagnosis not present

## 2016-03-14 DIAGNOSIS — H5201 Hypermetropia, right eye: Secondary | ICD-10-CM | POA: Diagnosis not present

## 2016-03-14 DIAGNOSIS — H52222 Regular astigmatism, left eye: Secondary | ICD-10-CM | POA: Diagnosis not present

## 2016-03-14 DIAGNOSIS — H524 Presbyopia: Secondary | ICD-10-CM | POA: Diagnosis not present

## 2016-06-05 DIAGNOSIS — Z23 Encounter for immunization: Secondary | ICD-10-CM | POA: Diagnosis not present

## 2016-06-25 ENCOUNTER — Ambulatory Visit (HOSPITAL_BASED_OUTPATIENT_CLINIC_OR_DEPARTMENT_OTHER)
Admission: RE | Admit: 2016-06-25 | Discharge: 2016-06-25 | Disposition: A | Discharge status: Home / Self Care | Payer: BLUE CROSS/BLUE SHIELD | Source: Ambulatory Visit | Attending: Family Medicine | Admitting: Family Medicine

## 2016-06-25 DIAGNOSIS — E2839 Other primary ovarian failure: Secondary | ICD-10-CM

## 2016-06-25 DIAGNOSIS — Z1231 Encounter for screening mammogram for malignant neoplasm of breast: Secondary | ICD-10-CM | POA: Diagnosis not present

## 2016-06-25 DIAGNOSIS — Z780 Asymptomatic menopausal state: Secondary | ICD-10-CM | POA: Diagnosis not present

## 2016-06-25 DIAGNOSIS — Z1239 Encounter for other screening for malignant neoplasm of breast: Secondary | ICD-10-CM

## 2016-06-25 DIAGNOSIS — M85852 Other specified disorders of bone density and structure, left thigh: Secondary | ICD-10-CM | POA: Insufficient documentation

## 2016-06-25 DIAGNOSIS — M85851 Other specified disorders of bone density and structure, right thigh: Secondary | ICD-10-CM | POA: Diagnosis not present

## 2016-06-28 ENCOUNTER — Encounter: Payer: Self-pay | Admitting: Family Medicine

## 2016-08-07 ENCOUNTER — Other Ambulatory Visit (INDEPENDENT_AMBULATORY_CARE_PROVIDER_SITE_OTHER): Payer: BLUE CROSS/BLUE SHIELD

## 2016-08-07 DIAGNOSIS — Z0000 Encounter for general adult medical examination without abnormal findings: Secondary | ICD-10-CM | POA: Diagnosis not present

## 2016-08-07 DIAGNOSIS — E785 Hyperlipidemia, unspecified: Secondary | ICD-10-CM | POA: Diagnosis not present

## 2016-08-07 LAB — LIPID PANEL
CHOL/HDL RATIO: 3
Cholesterol: 232 mg/dL — ABNORMAL HIGH (ref 0–200)
HDL: 88.5 mg/dL (ref >39.00)
LDL CALC: 133 mg/dL — AB (ref 0–99)
NonHDL: 143.19
TRIGLYCERIDES: 49 mg/dL (ref 0.0–149.0)
VLDL: 9.8 mg/dL (ref 0.0–40.0)

## 2016-08-07 LAB — COMPREHENSIVE METABOLIC PANEL
ALT: 24 U/L (ref 0–35)
AST: 26 U/L (ref 0–37)
Albumin: 4.3 g/dL (ref 3.5–5.2)
Alkaline Phosphatase: 37 U/L — ABNORMAL LOW (ref 39–117)
BUN: 16 mg/dL (ref 6–23)
CHLORIDE: 103 meq/L (ref 96–112)
CO2: 32 mEq/L (ref 19–32)
Calcium: 9.8 mg/dL (ref 8.4–10.5)
Creatinine, Ser: 0.76 mg/dL (ref 0.40–1.20)
GFR: 81.83 mL/min (ref >60.00)
Glucose, Bld: 92 mg/dL (ref 70–99)
POTASSIUM: 4.8 meq/L (ref 3.5–5.1)
Sodium: 142 mEq/L (ref 135–145)
Total Bilirubin: 0.6 mg/dL (ref 0.2–1.2)
Total Protein: 6.8 g/dL (ref 6.0–8.3)

## 2016-08-07 LAB — POCT URINALYSIS DIPSTICK
BILIRUBIN UA: NEGATIVE
GLUCOSE UA: NEGATIVE
Ketones, UA: NEGATIVE
LEUKOCYTES UA: NEGATIVE
NITRITE UA: NEGATIVE
Protein, UA: NEGATIVE
RBC UA: NEGATIVE
Spec Grav, UA: 1.025 (ref 1.010–1.025)
Urobilinogen, UA: 0.2 E.U./dL
pH, UA: 6 (ref 5.0–8.0)

## 2016-10-08 DIAGNOSIS — Z23 Encounter for immunization: Secondary | ICD-10-CM | POA: Diagnosis not present

## 2016-10-29 DIAGNOSIS — Z23 Encounter for immunization: Secondary | ICD-10-CM | POA: Diagnosis not present

## 2017-02-06 ENCOUNTER — Encounter: Payer: Self-pay | Admitting: Family Medicine

## 2017-03-08 ENCOUNTER — Encounter: Payer: BLUE CROSS/BLUE SHIELD | Admitting: Family Medicine

## 2017-03-12 DIAGNOSIS — Z85828 Personal history of other malignant neoplasm of skin: Secondary | ICD-10-CM | POA: Diagnosis not present

## 2017-03-12 DIAGNOSIS — D225 Melanocytic nevi of trunk: Secondary | ICD-10-CM | POA: Diagnosis not present

## 2017-03-12 DIAGNOSIS — Z86018 Personal history of other benign neoplasm: Secondary | ICD-10-CM | POA: Diagnosis not present

## 2017-03-12 DIAGNOSIS — D049 Carcinoma in situ of skin, unspecified: Secondary | ICD-10-CM | POA: Insufficient documentation

## 2017-03-12 DIAGNOSIS — Q825 Congenital non-neoplastic nevus: Secondary | ICD-10-CM | POA: Diagnosis not present

## 2017-03-12 DIAGNOSIS — D485 Neoplasm of uncertain behavior of skin: Secondary | ICD-10-CM | POA: Diagnosis not present

## 2017-03-15 ENCOUNTER — Encounter: Payer: Self-pay | Admitting: Family Medicine

## 2017-03-18 DIAGNOSIS — H524 Presbyopia: Secondary | ICD-10-CM | POA: Diagnosis not present

## 2017-03-18 DIAGNOSIS — H5201 Hypermetropia, right eye: Secondary | ICD-10-CM | POA: Diagnosis not present

## 2017-03-18 DIAGNOSIS — H52221 Regular astigmatism, right eye: Secondary | ICD-10-CM | POA: Diagnosis not present

## 2017-03-18 DIAGNOSIS — H5202 Hypermetropia, left eye: Secondary | ICD-10-CM | POA: Diagnosis not present

## 2017-03-19 ENCOUNTER — Encounter: Payer: Self-pay | Admitting: Family Medicine

## 2017-03-19 NOTE — Telephone Encounter (Signed)
She can do it early or do it the same day.

## 2017-03-26 ENCOUNTER — Ambulatory Visit (INDEPENDENT_AMBULATORY_CARE_PROVIDER_SITE_OTHER): Payer: BLUE CROSS/BLUE SHIELD | Admitting: Family Medicine

## 2017-03-26 ENCOUNTER — Encounter: Payer: Self-pay | Admitting: Family Medicine

## 2017-03-26 VITALS — BP 116/78 | HR 82 | Temp 98.1°F | Resp 16 | Ht 63.0 in | Wt 102.8 lb

## 2017-03-26 DIAGNOSIS — Z0000 Encounter for general adult medical examination without abnormal findings: Secondary | ICD-10-CM | POA: Insufficient documentation

## 2017-03-26 NOTE — Patient Instructions (Signed)
Preventive Care 40-64 Years, Female Preventive care refers to lifestyle choices and visits with your health care provider that can promote health and wellness. What does preventive care include?  A yearly physical exam. This is also called an annual well check.  Dental exams once or twice a year.  Routine eye exams. Ask your health care provider how often you should have your eyes checked.  Personal lifestyle choices, including: ? Daily care of your teeth and gums. ? Regular physical activity. ? Eating a healthy diet. ? Avoiding tobacco and drug use. ? Limiting alcohol use. ? Practicing safe sex. ? Taking low-dose aspirin daily starting at age 58. ? Taking vitamin and mineral supplements as recommended by your health care provider. What happens during an annual well check? The services and screenings done by your health care provider during your annual well check will depend on your age, overall health, lifestyle risk factors, and family history of disease. Counseling Your health care provider may ask you questions about your:  Alcohol use.  Tobacco use.  Drug use.  Emotional well-being.  Home and relationship well-being.  Sexual activity.  Eating habits.  Work and work Statistician.  Method of birth control.  Menstrual cycle.  Pregnancy history.  Screening You may have the following tests or measurements:  Height, weight, and BMI.  Blood pressure.  Lipid and cholesterol levels. These may be checked every 5 years, or more frequently if you are over 81 years old.  Skin check.  Lung cancer screening. You may have this screening every year starting at age 78 if you have a 30-pack-year history of smoking and currently smoke or have quit within the past 15 years.  Fecal occult blood test (FOBT) of the stool. You may have this test every year starting at age 65.  Flexible sigmoidoscopy or colonoscopy. You may have a sigmoidoscopy every 5 years or a colonoscopy  every 10 years starting at age 30.  Hepatitis C blood test.  Hepatitis B blood test.  Sexually transmitted disease (STD) testing.  Diabetes screening. This is done by checking your blood sugar (glucose) after you have not eaten for a while (fasting). You may have this done every 1-3 years.  Mammogram. This may be done every 1-2 years. Talk to your health care provider about when you should start having regular mammograms. This may depend on whether you have a family history of breast cancer.  BRCA-related cancer screening. This may be done if you have a family history of breast, ovarian, tubal, or peritoneal cancers.  Pelvic exam and Pap test. This may be done every 3 years starting at age 80. Starting at age 36, this may be done every 5 years if you have a Pap test in combination with an HPV test.  Bone density scan. This is done to screen for osteoporosis. You may have this scan if you are at high risk for osteoporosis.  Discuss your test results, treatment options, and if necessary, the need for more tests with your health care provider. Vaccines Your health care provider may recommend certain vaccines, such as:  Influenza vaccine. This is recommended every year.  Tetanus, diphtheria, and acellular pertussis (Tdap, Td) vaccine. You may need a Td booster every 10 years.  Varicella vaccine. You may need this if you have not been vaccinated.  Zoster vaccine. You may need this after age 5.  Measles, mumps, and rubella (MMR) vaccine. You may need at least one dose of MMR if you were born in  1957 or later. You may also need a second dose.  Pneumococcal 13-valent conjugate (PCV13) vaccine. You may need this if you have certain conditions and were not previously vaccinated.  Pneumococcal polysaccharide (PPSV23) vaccine. You may need one or two doses if you smoke cigarettes or if you have certain conditions.  Meningococcal vaccine. You may need this if you have certain  conditions.  Hepatitis A vaccine. You may need this if you have certain conditions or if you travel or work in places where you may be exposed to hepatitis A.  Hepatitis B vaccine. You may need this if you have certain conditions or if you travel or work in places where you may be exposed to hepatitis B.  Haemophilus influenzae type b (Hib) vaccine. You may need this if you have certain conditions.  Talk to your health care provider about which screenings and vaccines you need and how often you need them. This information is not intended to replace advice given to you by your health care provider. Make sure you discuss any questions you have with your health care provider. Document Released: 02/04/2015 Document Revised: 09/28/2015 Document Reviewed: 11/09/2014 Elsevier Interactive Patient Education  2018 Elsevier Inc.  

## 2017-03-26 NOTE — Assessment & Plan Note (Signed)
ghm utd Check labs See AVS 

## 2017-03-26 NOTE — Progress Notes (Signed)
Subjective:  I acted as a Education administrator for [employer]. Yancey Flemings, [place]   Patient ID: [name], female    DOB: 1955-02-28, 62 y.o.   MRN: [id number]  Chief Complaint  Patient presents with  . Annual Exam    HPI  Patient is in today for annual exam.  No complaints.   Patient Care Team: Carollee Herter, Alferd Apa, DO as PCP - General (Family Medicine) [place], Jennefer Bravo, MD as Referring Physician (Dermatology) Calvert Cantor, MD as Consulting Physician (Ophthalmology)   History reviewed. No pertinent past medical history.  Past Surgical History:  Procedure Laterality Date  . APPENDECTOMY  2012    Family History  Problem Relation Age of Onset  . Heart disease Father   . Stroke Mother   . Colon cancer Neg Hx   . Esophageal cancer Neg Hx   . Rectal cancer Neg Hx   . Stomach cancer Neg Hx     Social History   Socioeconomic History  . Marital status: Married    Spouse name: Not on file  . Number of children: Not on file  . Years of education: Not on file  . Highest education level: Not on file  Social Needs  . Financial resource strain: Not on file  . Food insecurity - worry: Not on file  . Food insecurity - inability: Not on file  . Transportation needs - medical: Not on file  . Transportation needs - non-medical: Not on file  Occupational History  . Not on file  Tobacco Use  . Smoking status: Never Smoker  . Smokeless tobacco: Never Used  Substance and Sexual Activity  . Alcohol use: Yes    Alcohol/week: 1.8 oz    Types: 3 Glasses of wine per week    Comment: wine daily  . Drug use: No  . Sexual activity: Yes    Birth control/protection: Post-menopausal  Other Topics Concern  . Not on file  Social History Narrative   Exercise 5 days a week   Live at home with husband    Outpatient Medications Prior to Visit  Medication Sig Dispense Refill  . aspirin 81 MG tablet Take 81 mg by mouth daily.    . Multiple Vitamins-Minerals (MULTIVITAMIN WITH MINERALS) tablet  Take 1 tablet by mouth daily.     No facility-administered medications prior to visit.     Allergies  Allergen Reactions  . Duricef [Cefadroxil Monohydrate] Rash    Review of Systems  Constitutional: Negative for chills, fever and malaise/fatigue.  HENT: Negative for congestion and hearing loss.   Eyes: Negative for discharge.  Respiratory: Negative for cough, sputum production and shortness of breath.   Cardiovascular: Negative for chest pain, palpitations and leg swelling.  Gastrointestinal: Negative for abdominal pain, blood in stool, constipation, diarrhea, heartburn, nausea and vomiting.  Genitourinary: Negative for dysuria, frequency, hematuria and urgency.  Musculoskeletal: Negative for back pain, falls and myalgias.  Skin: Negative for rash.  Neurological: Negative for dizziness, sensory change, loss of consciousness, weakness and headaches.  Endo/Heme/Allergies: Negative for environmental allergies. Does not bruise/bleed easily.  Psychiatric/Behavioral: Negative for depression and suicidal ideas. The patient is not nervous/anxious and does not have insomnia.        Objective:    Physical Exam  Constitutional: She is oriented to person, place, and time. She appears well-developed and well-nourished. No distress.  HENT:  Head: Normocephalic and atraumatic.  Right Ear: External ear normal.  Left Ear: External ear normal.  Nose: Nose normal.  Mouth/Throat: Oropharynx is clear and moist.  Eyes: Conjunctivae and EOM are normal. Pupils are equal, round, and reactive to light. Right eye exhibits no discharge. Left eye exhibits no discharge.  Neck: Normal range of motion. Neck supple. No JVD present. Carotid bruit is not present. No thyromegaly present.  Cardiovascular: Normal rate, regular rhythm, normal heart sounds and intact distal pulses.  No murmur heard. Pulmonary/Chest: Effort normal and breath sounds normal. No respiratory distress. She has no wheezes. She has no  rales. She exhibits no tenderness.  Abdominal: Soft. Bowel sounds are normal. She exhibits no distension and no mass. There is no tenderness. There is no rebound and no guarding.  Musculoskeletal: Normal range of motion. She exhibits no edema or tenderness.  Lymphadenopathy:    She has no cervical adenopathy.  Neurological: She is alert and oriented to person, place, and time. She has normal reflexes. No cranial nerve deficit.  Skin: Skin is warm and dry. No rash noted. She is not diaphoretic. No erythema.  Psychiatric: She has a normal mood and affect. Her behavior is normal. Judgment and thought content normal.  Nursing note and vitals reviewed.   BP 116/78 (BP Location: Left Arm, Patient Position: Sitting, Cuff Size: Normal)   Pulse 82   Temp 98.1 F (36.7 C) (Oral)   Resp 16   Ht 5\' 3"  (1.6 m)   Wt 102 lb 12.8 oz (46.6 kg)   SpO2 98%   BMI 18.21 kg/m  Wt Readings from Last 3 Encounters:  03/26/17 102 lb 12.8 oz (46.6 kg)  03/05/16 101 lb 9.6 oz (46.1 kg)  06/16/15 99 lb 3.2 oz (45 kg)   BP Readings from Last 3 Encounters:  03/26/17 116/78  03/05/16 116/78  06/16/15 98/60     Immunization History  Administered Date(s) Administered  . Influenza,inj,Quad PF,6+ Mos 10/12/2015  . Influenza-Unspecified 10/23/2014  . Tdap 03/03/2015  . Zoster 03/03/2015    Health Maintenance  Topic Date Due  . PAP SMEAR  06/16/2018  . MAMMOGRAM  06/26/2018  . COLONOSCOPY  04/27/2020  . TETANUS/TDAP  03/02/2025  . INFLUENZA VACCINE  Completed  . Hepatitis C Screening  Completed  . HIV Screening  Completed    Lab Results  Component Value Date   WBC 3.5 (L) 03/06/2016   HGB 14.8 03/06/2016   HCT 43.6 03/06/2016   PLT 222.0 03/06/2016   GLUCOSE 92 08/07/2016   CHOL 232 (H) 08/07/2016   TRIG 49.0 08/07/2016   HDL 88.50 08/07/2016   LDLCALC 133 (H) 08/07/2016   ALT 24 08/07/2016   AST 26 08/07/2016   NA 142 08/07/2016   K 4.8 08/07/2016   CL 103 08/07/2016   CREATININE 0.76  08/07/2016   BUN 16 08/07/2016   CO2 32 08/07/2016   TSH 3.20 03/06/2016    Lab Results  Component Value Date   TSH 3.20 03/06/2016   Lab Results  Component Value Date   WBC 3.5 (L) 03/06/2016   HGB 14.8 03/06/2016   HCT 43.6 03/06/2016   MCV 98.2 03/06/2016   PLT 222.0 03/06/2016   Lab Results  Component Value Date   NA 142 08/07/2016   K 4.8 08/07/2016   CO2 32 08/07/2016   GLUCOSE 92 08/07/2016   BUN 16 08/07/2016   CREATININE 0.76 08/07/2016   BILITOT 0.6 08/07/2016   ALKPHOS 37 (L) 08/07/2016   AST 26 08/07/2016   ALT 24 08/07/2016   PROT 6.8 08/07/2016   ALBUMIN 4.3 08/07/2016   CALCIUM  9.8 08/07/2016   GFR 81.83 08/07/2016   Lab Results  Component Value Date   CHOL 232 (H) 08/07/2016   Lab Results  Component Value Date   HDL 88.50 08/07/2016   Lab Results  Component Value Date   LDLCALC 133 (H) 08/07/2016   Lab Results  Component Value Date   TRIG 49.0 08/07/2016   Lab Results  Component Value Date   CHOLHDL 3 08/07/2016   No results found for: HGBA1C       Assessment & Plan:   Problem List Items Addressed This Visit      Unprioritized   Preventative health care - Primary    ghm utd Check labs See AVS      Relevant Orders   Lipid panel   CBC with Differential/Platelet   TSH   Comprehensive metabolic panel      I am having [name] maintain her aspirin and multivitamin with minerals.  No orders of the defined types were placed in this encounter.   CMA served as Education administrator during this visit. History, Physical and Plan performed by medical provider. Documentation and orders reviewed and attested to.  Ann Held, DO

## 2017-03-27 ENCOUNTER — Other Ambulatory Visit (INDEPENDENT_AMBULATORY_CARE_PROVIDER_SITE_OTHER): Payer: BLUE CROSS/BLUE SHIELD

## 2017-03-27 DIAGNOSIS — Z0000 Encounter for general adult medical examination without abnormal findings: Secondary | ICD-10-CM

## 2017-03-27 LAB — COMPREHENSIVE METABOLIC PANEL
ALK PHOS: 37 U/L — AB (ref 39–117)
ALT: 24 U/L (ref 0–35)
AST: 20 U/L (ref 0–37)
Albumin: 4.4 g/dL (ref 3.5–5.2)
BUN: 16 mg/dL (ref 6–23)
CHLORIDE: 105 meq/L (ref 96–112)
CO2: 34 meq/L — AB (ref 19–32)
Calcium: 10.2 mg/dL (ref 8.4–10.5)
Creatinine, Ser: 0.84 mg/dL (ref 0.40–1.20)
GFR: 72.75 mL/min (ref >60.00)
GLUCOSE: 98 mg/dL (ref 70–99)
POTASSIUM: 5.3 meq/L — AB (ref 3.5–5.1)
SODIUM: 146 meq/L — AB (ref 135–145)
TOTAL PROTEIN: 7.2 g/dL (ref 6.0–8.3)
Total Bilirubin: 0.8 mg/dL (ref 0.2–1.2)

## 2017-03-27 LAB — LIPID PANEL
CHOL/HDL RATIO: 2
Cholesterol: 229 mg/dL — ABNORMAL HIGH (ref 0–200)
HDL: 95.6 mg/dL (ref >39.00)
LDL Cholesterol: 118 mg/dL — ABNORMAL HIGH (ref 0–99)
NONHDL: 132.97
Triglycerides: 75 mg/dL (ref 0.0–149.0)
VLDL: 15 mg/dL (ref 0.0–40.0)

## 2017-03-27 LAB — CBC WITH DIFFERENTIAL/PLATELET
BASOS PCT: 1.2 % (ref 0.0–3.0)
Basophils Absolute: 0 10*3/uL (ref 0.0–0.1)
EOS PCT: 3.1 % (ref 0.0–5.0)
Eosinophils Absolute: 0.1 10*3/uL (ref 0.0–0.7)
HCT: 45.7 % (ref 36.0–46.0)
Hemoglobin: 15.2 g/dL — ABNORMAL HIGH (ref 12.0–15.0)
LYMPHS ABS: 1.3 10*3/uL (ref 0.7–4.0)
LYMPHS PCT: 36.9 % (ref 12.0–46.0)
MCHC: 33.3 g/dL (ref 30.0–36.0)
MCV: 100.1 fl — AB (ref 78.0–100.0)
MONOS PCT: 10.5 % (ref 3.0–12.0)
Monocytes Absolute: 0.4 10*3/uL (ref 0.1–1.0)
NEUTROS ABS: 1.7 10*3/uL (ref 1.4–7.7)
NEUTROS PCT: 48.3 % (ref 43.0–77.0)
PLATELETS: 214 10*3/uL (ref 150.0–400.0)
RBC: 4.56 Mil/uL (ref 3.87–5.11)
RDW: 12.9 % (ref 11.5–15.5)
WBC: 3.6 10*3/uL — ABNORMAL LOW (ref 4.0–10.5)

## 2017-03-27 LAB — TSH: TSH: 2.25 u[IU]/mL (ref 0.35–4.50)

## 2017-03-29 ENCOUNTER — Other Ambulatory Visit: Payer: Self-pay | Admitting: *Deleted

## 2017-03-29 DIAGNOSIS — D582 Other hemoglobinopathies: Secondary | ICD-10-CM

## 2017-03-29 DIAGNOSIS — E785 Hyperlipidemia, unspecified: Secondary | ICD-10-CM

## 2017-04-01 DIAGNOSIS — C44519 Basal cell carcinoma of skin of other part of trunk: Secondary | ICD-10-CM | POA: Diagnosis not present

## 2017-05-15 ENCOUNTER — Other Ambulatory Visit: Payer: Self-pay | Admitting: Family Medicine

## 2017-05-15 DIAGNOSIS — Z1231 Encounter for screening mammogram for malignant neoplasm of breast: Secondary | ICD-10-CM

## 2017-07-01 ENCOUNTER — Ambulatory Visit (HOSPITAL_BASED_OUTPATIENT_CLINIC_OR_DEPARTMENT_OTHER)
Admission: RE | Admit: 2017-07-01 | Discharge: 2017-07-01 | Disposition: A | Discharge status: Home / Self Care | Payer: BLUE CROSS/BLUE SHIELD | Source: Ambulatory Visit | Attending: Family Medicine | Admitting: Family Medicine

## 2017-07-01 DIAGNOSIS — Z1231 Encounter for screening mammogram for malignant neoplasm of breast: Secondary | ICD-10-CM | POA: Insufficient documentation

## 2017-10-09 DIAGNOSIS — Z23 Encounter for immunization: Secondary | ICD-10-CM | POA: Diagnosis not present

## 2018-01-23 ENCOUNTER — Encounter: Payer: Self-pay | Admitting: Family Medicine

## 2018-03-11 DIAGNOSIS — Z85828 Personal history of other malignant neoplasm of skin: Secondary | ICD-10-CM | POA: Diagnosis not present

## 2018-03-11 DIAGNOSIS — Z86018 Personal history of other benign neoplasm: Secondary | ICD-10-CM | POA: Diagnosis not present

## 2018-03-11 DIAGNOSIS — D225 Melanocytic nevi of trunk: Secondary | ICD-10-CM | POA: Diagnosis not present

## 2018-03-11 DIAGNOSIS — Z23 Encounter for immunization: Secondary | ICD-10-CM | POA: Diagnosis not present

## 2018-03-19 DIAGNOSIS — H5201 Hypermetropia, right eye: Secondary | ICD-10-CM | POA: Diagnosis not present

## 2018-03-19 DIAGNOSIS — H5202 Hypermetropia, left eye: Secondary | ICD-10-CM | POA: Diagnosis not present

## 2018-03-19 DIAGNOSIS — H524 Presbyopia: Secondary | ICD-10-CM | POA: Diagnosis not present

## 2018-03-19 DIAGNOSIS — H52221 Regular astigmatism, right eye: Secondary | ICD-10-CM | POA: Diagnosis not present

## 2018-03-26 ENCOUNTER — Encounter: Payer: Self-pay | Admitting: Family Medicine

## 2018-03-28 ENCOUNTER — Other Ambulatory Visit: Payer: Self-pay | Admitting: *Deleted

## 2018-03-28 ENCOUNTER — Other Ambulatory Visit: Payer: BLUE CROSS/BLUE SHIELD

## 2018-03-28 DIAGNOSIS — Z0000 Encounter for general adult medical examination without abnormal findings: Secondary | ICD-10-CM

## 2018-03-28 DIAGNOSIS — E785 Hyperlipidemia, unspecified: Secondary | ICD-10-CM

## 2018-03-31 ENCOUNTER — Encounter: Payer: Self-pay | Admitting: Family Medicine

## 2018-03-31 ENCOUNTER — Other Ambulatory Visit (HOSPITAL_BASED_OUTPATIENT_CLINIC_OR_DEPARTMENT_OTHER): Payer: Self-pay | Admitting: Family Medicine

## 2018-03-31 ENCOUNTER — Ambulatory Visit (INDEPENDENT_AMBULATORY_CARE_PROVIDER_SITE_OTHER): Payer: BLUE CROSS/BLUE SHIELD | Admitting: Family Medicine

## 2018-03-31 ENCOUNTER — Telehealth: Payer: Self-pay | Admitting: *Deleted

## 2018-03-31 ENCOUNTER — Other Ambulatory Visit (HOSPITAL_COMMUNITY)
Admission: RE | Admit: 2018-03-31 | Discharge: 2018-03-31 | Disposition: A | Discharge status: Home / Self Care | Payer: BLUE CROSS/BLUE SHIELD | Source: Ambulatory Visit | Attending: Family Medicine | Admitting: Family Medicine

## 2018-03-31 VITALS — BP 110/64 | HR 71 | Temp 97.5°F | Resp 12 | Ht 63.0 in | Wt 101.0 lb

## 2018-03-31 DIAGNOSIS — Z0000 Encounter for general adult medical examination without abnormal findings: Secondary | ICD-10-CM

## 2018-03-31 DIAGNOSIS — E785 Hyperlipidemia, unspecified: Secondary | ICD-10-CM

## 2018-03-31 DIAGNOSIS — Z1231 Encounter for screening mammogram for malignant neoplasm of breast: Secondary | ICD-10-CM

## 2018-03-31 DIAGNOSIS — E2839 Other primary ovarian failure: Secondary | ICD-10-CM | POA: Diagnosis not present

## 2018-03-31 LAB — TSH: TSH: 2.67 u[IU]/mL (ref 0.35–4.50)

## 2018-03-31 LAB — COMPREHENSIVE METABOLIC PANEL
ALT: 20 U/L (ref 0–35)
AST: 20 U/L (ref 0–37)
Albumin: 4.5 g/dL (ref 3.5–5.2)
Alkaline Phosphatase: 41 U/L (ref 39–117)
BUN: 14 mg/dL (ref 6–23)
CO2: 31 mEq/L (ref 19–32)
CREATININE: 0.79 mg/dL (ref 0.40–1.20)
Calcium: 9.5 mg/dL (ref 8.4–10.5)
Chloride: 99 mEq/L (ref 96–112)
GFR: 73.24 mL/min (ref >60.00)
Glucose, Bld: 91 mg/dL (ref 70–99)
Potassium: 5.1 mEq/L (ref 3.5–5.1)
Sodium: 137 mEq/L (ref 135–145)
Total Bilirubin: 0.5 mg/dL (ref 0.2–1.2)
Total Protein: 6.7 g/dL (ref 6.0–8.3)

## 2018-03-31 LAB — CBC WITH DIFFERENTIAL/PLATELET
BASOS ABS: 0 10*3/uL (ref 0.0–0.1)
Basophils Relative: 1.1 % (ref 0.0–3.0)
EOS ABS: 0.1 10*3/uL (ref 0.0–0.7)
Eosinophils Relative: 3.3 % (ref 0.0–5.0)
HCT: 43.8 % (ref 36.0–46.0)
Hemoglobin: 15 g/dL (ref 12.0–15.0)
Lymphocytes Relative: 34.4 % (ref 12.0–46.0)
Lymphs Abs: 1.3 10*3/uL (ref 0.7–4.0)
MCHC: 34.2 g/dL (ref 30.0–36.0)
MCV: 98.4 fl (ref 78.0–100.0)
Monocytes Absolute: 0.3 10*3/uL (ref 0.1–1.0)
Monocytes Relative: 9 % (ref 3.0–12.0)
NEUTROS PCT: 52.2 % (ref 43.0–77.0)
Neutro Abs: 1.9 10*3/uL (ref 1.4–7.7)
Platelets: 214 10*3/uL (ref 150.0–400.0)
RBC: 4.45 Mil/uL (ref 3.87–5.11)
RDW: 12.6 % (ref 11.5–15.5)
WBC: 3.7 10*3/uL — ABNORMAL LOW (ref 4.0–10.5)

## 2018-03-31 LAB — LIPID PANEL
CHOL/HDL RATIO: 3
Cholesterol: 240 mg/dL — ABNORMAL HIGH (ref 0–200)
HDL: 89.2 mg/dL (ref >39.00)
LDL CALC: 140 mg/dL — AB (ref 0–99)
NonHDL: 150.89
Triglycerides: 55 mg/dL (ref 0.0–149.0)
VLDL: 11 mg/dL (ref 0.0–40.0)

## 2018-03-31 NOTE — Progress Notes (Signed)
Subjective:     [name] is a 63 y.o. female and is here for a comprehensive physical exam. The patient reports no problems.  Social History   Socioeconomic History  . Marital status: Married    Spouse name: Not on file  . Number of children: Not on file  . Years of education: Not on file  . Highest education level: Not on file  Occupational History  . Not on file  Social Needs  . Financial resource strain: Not on file  . Food insecurity:    Worry: Not on file    Inability: Not on file  . Transportation needs:    Medical: Not on file    Non-medical: Not on file  Tobacco Use  . Smoking status: Never Smoker  . Smokeless tobacco: Never Used  Substance and Sexual Activity  . Alcohol use: Yes    Alcohol/week: 3.0 standard drinks    Types: 3 Glasses of wine per week    Comment: wine daily  . Drug use: No  . Sexual activity: Yes    Birth control/protection: Post-menopausal  Lifestyle  . Physical activity:    Days per week: Not on file    Minutes per session: Not on file  . Stress: Not on file  Relationships  . Social connections:    Talks on phone: Not on file    Gets together: Not on file    Attends religious service: Not on file    Active member of club or organization: Not on file    Attends meetings of clubs or organizations: Not on file    Relationship status: Not on file  . Intimate partner violence:    Fear of current or ex partner: Not on file    Emotionally abused: Not on file    Physically abused: Not on file    Forced sexual activity: Not on file  Other Topics Concern  . Not on file  Social History Narrative   Exercise 5 days a week   Live at home with husband   Health Maintenance  Topic Date Due  . PAP SMEAR-Modifier  06/16/2018  . MAMMOGRAM  07/02/2018  . COLONOSCOPY  04/27/2020  . TETANUS/TDAP  03/02/2025  . INFLUENZA VACCINE  Completed  . Hepatitis C Screening  Completed  . HIV Screening  Completed     Review of Systems Review of  Systems  Constitutional: Negative for activity change, appetite change and fatigue.  HENT: Negative for hearing loss, congestion, tinnitus and ear discharge.  dentist q66m Eyes: Negative for visual disturbance (see optho q1y -- vision corrected to 20/20 with glasses).  Respiratory: Negative for cough, chest tightness and shortness of breath.   Cardiovascular: Negative for chest pain, palpitations and leg swelling.  Gastrointestinal: Negative for abdominal pain, diarrhea, constipation and abdominal distention.  Genitourinary: Negative for urgency, frequency, decreased urine volume and difficulty urinating.  Musculoskeletal: Negative for back pain, arthralgias and gait problem.  Skin: Negative for color change, pallor and rash.  Neurological: Negative for dizziness, light-headedness, numbness and headaches.  Hematological: Negative for adenopathy. Does not bruise/bleed easily.  Psychiatric/Behavioral: Negative for suicidal ideas, confusion, sleep disturbance, self-injury, dysphoric mood, decreased concentration and agitation.       Objective:    BP 110/64 (BP Location: Left Arm, Patient Position: Sitting, Cuff Size: Normal)   Pulse 71   Temp (!) 97.5 F (36.4 C)   Resp 12   Ht 5\' 3"  (1.6 m)   Wt 101 lb (45.8 kg)  SpO2 97%   BMI 17.89 kg/m  General appearance: alert, cooperative, appears stated age and no distress Head: Normocephalic, without obvious abnormality, atraumatic Eyes: conjunctivae/corneas clear. PERRL, EOM's intact. Fundi benign. Ears: normal TM's and external ear canals both ears Nose: Nares normal. Septum midline. Mucosa normal. No drainage or sinus tenderness. Throat: lips, mucosa, and tongue normal; teeth and gums normal Neck: no adenopathy, no carotid bruit, no JVD, supple, symmetrical, trachea midline and thyroid not enlarged, symmetric, no tenderness/mass/nodules Back: symmetric, no curvature. ROM normal. No CVA tenderness. Lungs: clear to auscultation  bilaterally Breasts: normal appearance, no masses or tenderness Heart: regular rate and rhythm, S1, S2 normal, no murmur, click, rub or gallop Abdomen: soft, non-tender; bowel sounds normal; no masses,  no organomegaly Pelvic: cervix normal in appearance, external genitalia normal, no adnexal masses or tenderness, no cervical motion tenderness, rectovaginal septum normal, uterus normal size, shape, and consistency, vagina normal without discharge and pap done, rectal heme neg brown stool Extremities: extremities normal, atraumatic, no cyanosis or edema Pulses: 2+ and symmetric Skin: Skin color, texture, turgor normal. No rashes or lesions Lymph nodes: Cervical, supraclavicular, and axillary nodes normal. Neurologic: Alert and oriented X 3, normal strength and tone. Normal symmetric reflexes. Normal coordination and gait    Assessment:    Healthy female exam.      Plan:     ghm utd Check labs  See After Visit Summary for Counseling Recommendations    1. Hyperlipidemia, unspecified hyperlipidemia type Encouraged heart healthy diet, increase exercise, avoid trans fats, consider a krill oil cap daily - TSH - Lipid panel - CBC with Differential/Platelet - Comprehensive metabolic panel - Lipid panel - Comprehensive metabolic panel  2. Preventative health care See above - TSH - Lipid panel - CBC with Differential/Platelet - Comprehensive metabolic panel - Lipid panel - CBC with Differential/Platelet - TSH - Comprehensive metabolic panel - Cytology - PAP  3. Estrogen deficiency   - DG Bone Density; Future

## 2018-03-31 NOTE — Patient Instructions (Signed)
Preventive Care 40-64 Years, Female Preventive care refers to lifestyle choices and visits with your health care provider that can promote health and wellness. What does preventive care include?   A yearly physical exam. This is also called an annual well check.  Dental exams once or twice a year.  Routine eye exams. Ask your health care provider how often you should have your eyes checked.  Personal lifestyle choices, including: ? Daily care of your teeth and gums. ? Regular physical activity. ? Eating a healthy diet. ? Avoiding tobacco and drug use. ? Limiting alcohol use. ? Practicing safe sex. ? Taking low-dose aspirin daily starting at age 50. ? Taking vitamin and mineral supplements as recommended by your health care provider. What happens during an annual well check? The services and screenings done by your health care provider during your annual well check will depend on your age, overall health, lifestyle risk factors, and family history of disease. Counseling Your health care provider may ask you questions about your:  Alcohol use.  Tobacco use.  Drug use.  Emotional well-being.  Home and relationship well-being.  Sexual activity.  Eating habits.  Work and work environment.  Method of birth control.  Menstrual cycle.  Pregnancy history. Screening You may have the following tests or measurements:  Height, weight, and BMI.  Blood pressure.  Lipid and cholesterol levels. These may be checked every 5 years, or more frequently if you are over 50 years old.  Skin check.  Lung cancer screening. You may have this screening every year starting at age 55 if you have a 30-pack-year history of smoking and currently smoke or have quit within the past 15 years.  Colorectal cancer screening. All adults should have this screening starting at age 50 and continuing until age 75. Your health care provider may recommend screening at age 45. You will have tests every  1-10 years, depending on your results and the type of screening test. People at increased risk should start screening at an earlier age. Screening tests may include: ? Guaiac-based fecal occult blood testing. ? Fecal immunochemical test (FIT). ? Stool DNA test. ? Virtual colonoscopy. ? Sigmoidoscopy. During this test, a flexible tube with a tiny camera (sigmoidoscope) is used to examine your rectum and lower colon. The sigmoidoscope is inserted through your anus into your rectum and lower colon. ? Colonoscopy. During this test, a long, thin, flexible tube with a tiny camera (colonoscope) is used to examine your entire colon and rectum.  Hepatitis C blood test.  Hepatitis B blood test.  Sexually transmitted disease (STD) testing.  Diabetes screening. This is done by checking your blood sugar (glucose) after you have not eaten for a while (fasting). You may have this done every 1-3 years.  Mammogram. This may be done every 1-2 years. Talk to your health care provider about when you should start having regular mammograms. This may depend on whether you have a family history of breast cancer.  BRCA-related cancer screening. This may be done if you have a family history of breast, ovarian, tubal, or peritoneal cancers.  Pelvic exam and Pap test. This may be done every 3 years starting at age 21. Starting at age 30, this may be done every 5 years if you have a Pap test in combination with an HPV test.  Bone density scan. This is done to screen for osteoporosis. You may have this scan if you are at high risk for osteoporosis. Discuss your test results, treatment options,   and if necessary, the need for more tests with your health care provider. Vaccines Your health care provider may recommend certain vaccines, such as:  Influenza vaccine. This is recommended every year.  Tetanus, diphtheria, and acellular pertussis (Tdap, Td) vaccine. You may need a Td booster every 10 years.  Varicella  vaccine. You may need this if you have not been vaccinated.  Zoster vaccine. You may need this after age 38.  Measles, mumps, and rubella (MMR) vaccine. You may need at least one dose of MMR if you were born in 1957 or later. You may also need a second dose.  Pneumococcal 13-valent conjugate (PCV13) vaccine. You may need this if you have certain conditions and were not previously vaccinated.  Pneumococcal polysaccharide (PPSV23) vaccine. You may need one or two doses if you smoke cigarettes or if you have certain conditions.  Meningococcal vaccine. You may need this if you have certain conditions.  Hepatitis A vaccine. You may need this if you have certain conditions or if you travel or work in places where you may be exposed to hepatitis A.  Hepatitis B vaccine. You may need this if you have certain conditions or if you travel or work in places where you may be exposed to hepatitis B.  Haemophilus influenzae type b (Hib) vaccine. You may need this if you have certain conditions. Talk to your health care provider about which screenings and vaccines you need and how often you need them. This information is not intended to replace advice given to you by your health care provider. Make sure you discuss any questions you have with your health care provider. Document Released: 02/04/2015 Document Revised: 02/28/2017 Document Reviewed: 11/09/2014 Elsevier Interactive Patient Education  2019 [employer].

## 2018-03-31 NOTE — Telephone Encounter (Signed)
Patient labs were drawn earlier this morning

## 2018-03-31 NOTE — Telephone Encounter (Signed)
Copied from [place] [id number]. Topic: Appointment Scheduling - Scheduling Inquiry for Clinic >> Mar 28, 2018  2:51 PM [name] wrote: Reason for CRM: Patient was advised via mychart to set up lab only appointment on 3/9 prior to appointment with Dr. Carollee Herter that same day at 8:30. Only able to schedule a 9:45, as I cannot overbook. Patient states she will arrive at 7, if okay. Please advise.

## 2018-04-01 LAB — CYTOLOGY - PAP
DIAGNOSIS: NEGATIVE
HPV: NOT DETECTED

## 2018-04-02 ENCOUNTER — Encounter: Payer: Self-pay | Admitting: *Deleted

## 2018-04-08 ENCOUNTER — Telehealth: Payer: BLUE CROSS/BLUE SHIELD | Admitting: Nurse Practitioner

## 2018-04-08 DIAGNOSIS — L03031 Cellulitis of right toe: Secondary | ICD-10-CM

## 2018-04-08 MED ORDER — SULFAMETHOXAZOLE-TRIMETHOPRIM 800-160 MG PO TABS: 1.0000 | ORAL_TABLET | Freq: Two times a day (BID) | ORAL | 0 refills | Status: DC

## 2018-04-08 NOTE — Progress Notes (Signed)
E Visit for Cellulitis  We are sorry that you are not feeling well. Here is how we plan to help!  Based on what you shared with me it looks like you have cellulitis.  Cellulitis looks like areas of skin redness, swelling, and warmth; it develops as a result of bacteria entering under the skin. Little red spots and/or bleeding can be seen in skin, and tiny surface sacs containing fluid can occur. Fever can be present. Cellulitis is almost always on one side of a body, and the lower limbs are the most common site of involvement.   I have prescribed:  Bactrim DS 1 tablet by mouth BID for 7 days  HOME CARE:  . Take your medications as ordered and take all of them, even if the skin irritation appears to be healing.   GET HELP RIGHT AWAY IF:  . Symptoms that don't begin to go away within 48 hours. . Severe redness persists or worsens . If the area turns color, spreads or swells. . If it blisters and opens, develops yellow-brown crust or bleeds. . You develop a fever or chills. . If the pain increases or becomes unbearable.  . Are unable to keep fluids and food down.  MAKE SURE YOU    Understand these instructions.  Will watch your condition.  Will get help right away if you are not doing well or get worse.  Thank you for choosing an e-visit. Your e-visit answers were reviewed by a board certified advanced clinical practitioner to complete your personal care plan. Depending upon the condition, your plan could have included both over the counter or prescription medications. Please review your pharmacy choice. Make sure the pharmacy is open so you can pick up prescription now. If there is a problem, you may contact your provider through [employer] and have the prescription routed to another pharmacy. Your safety is important to [place]. If you have drug allergies check your prescription carefully.  For the next 24 hours you can use MyChart to ask questions about today's visit, request a  non-urgent call back, or ask for a work or school excuse. You will get an email in the next two days asking about your experience. I hope that your e-visit has been valuable and will speed your recovery.  5 minutes spent reviewing and documenting in chart.

## 2018-07-07 ENCOUNTER — Other Ambulatory Visit (HOSPITAL_BASED_OUTPATIENT_CLINIC_OR_DEPARTMENT_OTHER): Payer: BC Managed Care – PPO

## 2018-07-07 ENCOUNTER — Ambulatory Visit (HOSPITAL_BASED_OUTPATIENT_CLINIC_OR_DEPARTMENT_OTHER): Payer: BLUE CROSS/BLUE SHIELD

## 2018-07-14 ENCOUNTER — Other Ambulatory Visit: Payer: Self-pay

## 2018-07-14 ENCOUNTER — Ambulatory Visit (HOSPITAL_BASED_OUTPATIENT_CLINIC_OR_DEPARTMENT_OTHER)
Admission: RE | Admit: 2018-07-14 | Discharge: 2018-07-14 | Disposition: A | Discharge status: Home / Self Care | Payer: BC Managed Care – PPO | Source: Ambulatory Visit | Attending: Family Medicine | Admitting: Family Medicine

## 2018-07-14 ENCOUNTER — Encounter (HOSPITAL_BASED_OUTPATIENT_CLINIC_OR_DEPARTMENT_OTHER): Payer: Self-pay

## 2018-07-14 DIAGNOSIS — E2839 Other primary ovarian failure: Secondary | ICD-10-CM | POA: Diagnosis not present

## 2018-07-14 DIAGNOSIS — Z13820 Encounter for screening for osteoporosis: Secondary | ICD-10-CM | POA: Diagnosis not present

## 2018-07-14 DIAGNOSIS — Z780 Asymptomatic menopausal state: Secondary | ICD-10-CM | POA: Insufficient documentation

## 2018-07-14 DIAGNOSIS — Z1231 Encounter for screening mammogram for malignant neoplasm of breast: Secondary | ICD-10-CM | POA: Diagnosis not present

## 2018-07-14 DIAGNOSIS — M8580 Other specified disorders of bone density and structure, unspecified site: Secondary | ICD-10-CM | POA: Insufficient documentation

## 2018-07-14 DIAGNOSIS — M85852 Other specified disorders of bone density and structure, left thigh: Secondary | ICD-10-CM | POA: Diagnosis not present

## 2018-07-15 ENCOUNTER — Other Ambulatory Visit: Payer: Self-pay | Admitting: Family Medicine

## 2018-07-15 DIAGNOSIS — R928 Other abnormal and inconclusive findings on diagnostic imaging of breast: Secondary | ICD-10-CM

## 2018-07-17 ENCOUNTER — Ambulatory Visit
Admission: RE | Admit: 2018-07-17 | Discharge: 2018-07-17 | Disposition: A | Discharge status: Home / Self Care | Payer: BC Managed Care – PPO | Source: Ambulatory Visit | Attending: Family Medicine | Admitting: Family Medicine

## 2018-07-17 ENCOUNTER — Other Ambulatory Visit: Payer: Self-pay | Admitting: Family Medicine

## 2018-07-17 DIAGNOSIS — N6489 Other specified disorders of breast: Secondary | ICD-10-CM | POA: Diagnosis not present

## 2018-07-17 DIAGNOSIS — R928 Other abnormal and inconclusive findings on diagnostic imaging of breast: Secondary | ICD-10-CM

## 2018-07-17 DIAGNOSIS — R922 Inconclusive mammogram: Secondary | ICD-10-CM | POA: Diagnosis not present

## 2018-07-17 IMAGING — US ULTRASOUND RIGHT BREAST LIMITED
1 series · 5 of 5 positions shown · non-contrast
Comparison: Previous exam(s).

CLINICAL DATA: 63-year-old female for further evaluation of RIGHT
breast asymmetry on screening mammogram

EXAM:
DIGITAL DIAGNOSTIC RIGHT MAMMOGRAM WITH CAD AND TOMO
ULTRASOUND RIGHT BREAST

[Series 1: ultrasound right breast limited · 0.06mm/px · 5 of 5 slices shown]
[im 1/5]
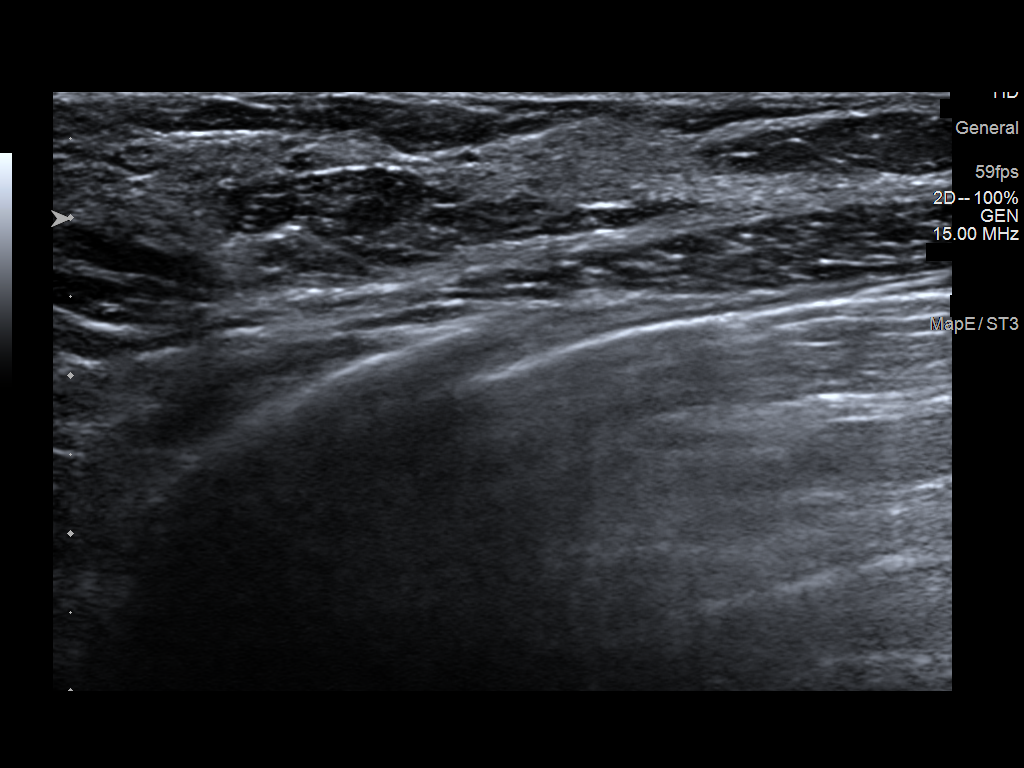
[im 2/5]
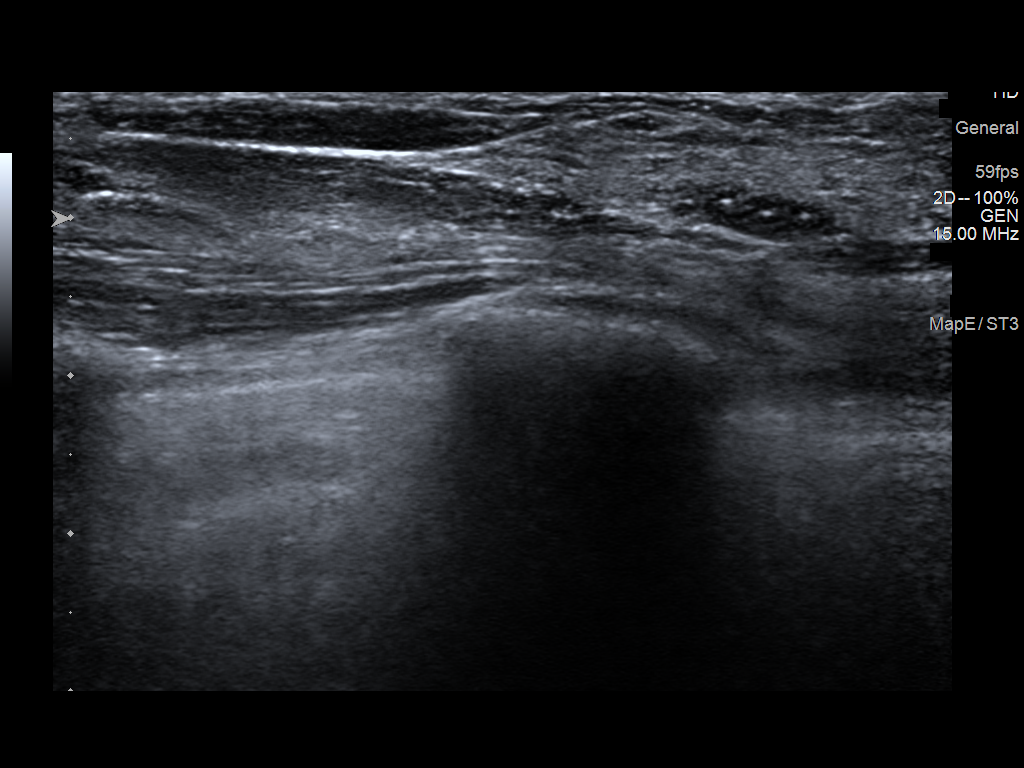
[im 3/5]
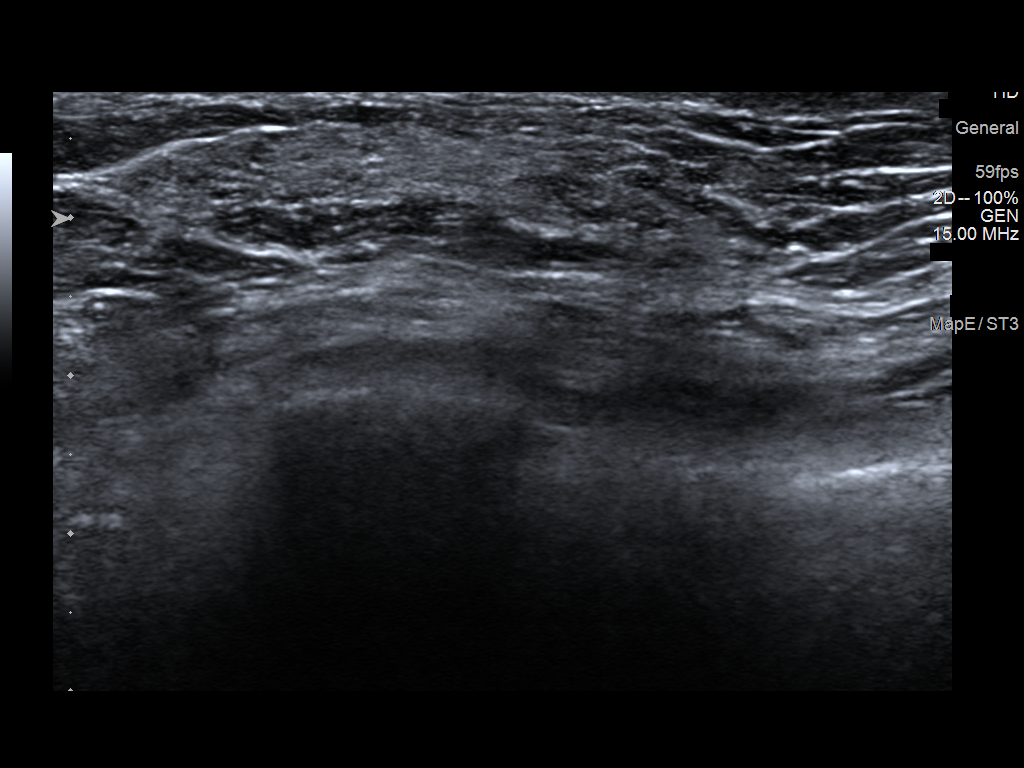
[im 4/5]
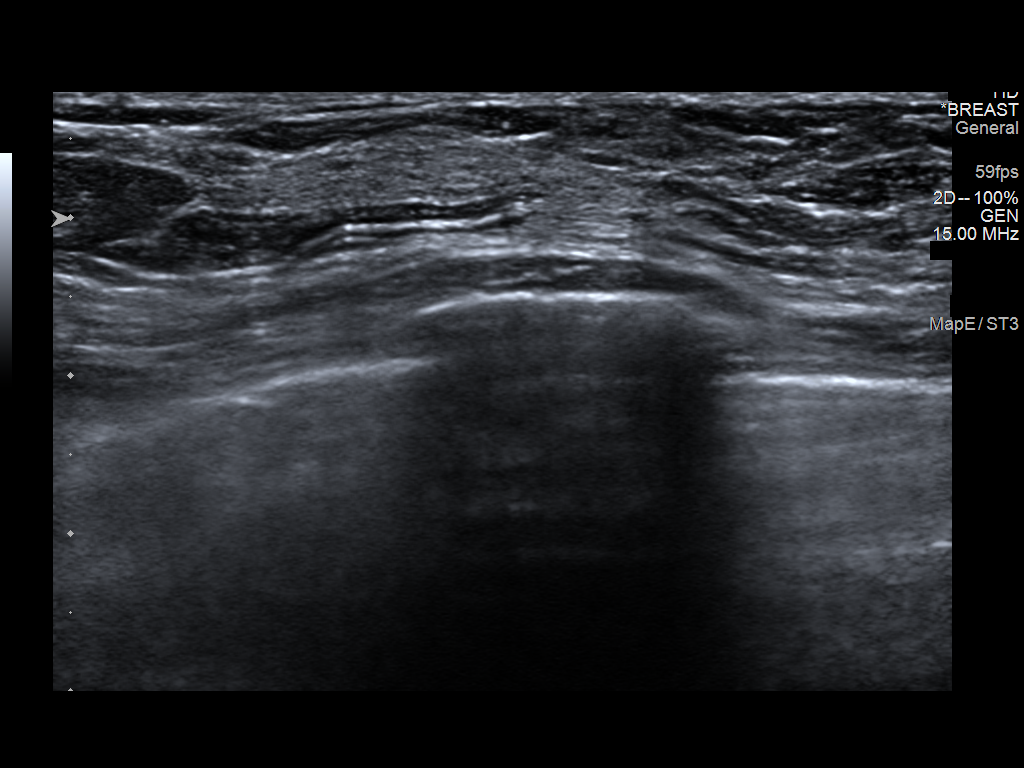
[im 5/5]
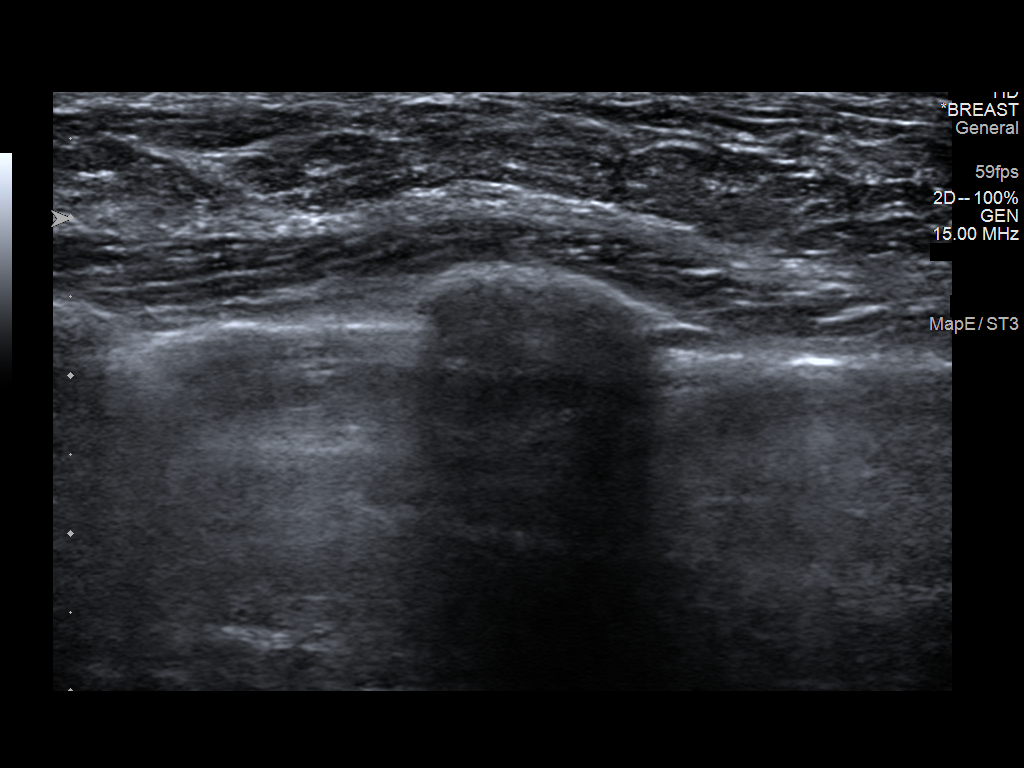

[5 of 5 positions shown; findings below may reference images not displayed]

ACR Breast Density Category c: The breast tissue is heterogeneously
dense, which may obscure small masses.
FINDINGS: 2D/3D full field and spot compression views of the RIGHT breast
demonstrate persistent but less conspicuous focal asymmetries within
the posterior UPPER RIGHT breast.

Mammographic images were processed with CAD.

Targeted ultrasound is performed, showing no solid or cystic mass,
distortion or abnormal shadowing within the UPPER RIGHT breast.
IMPRESSION: 1. Persistent but less conspicuous focal asymmetries within the
posterior UPPER RIGHT breast without sonographic correlate - likely
representing normal fibroglandular tissue but 6 month follow-up
recommended to ensure stability.

RECOMMENDATION:
RIGHT diagnostic mammogram with possible RIGHT breast ultrasound in
6 months.

I have discussed the findings and recommendations with the patient.
If applicable, a reminder letter will be sent to the patient
regarding the next appointment.

BI-RADS CATEGORY  3: Probably benign.

## 2019-01-13 ENCOUNTER — Other Ambulatory Visit: Payer: Self-pay

## 2019-01-13 ENCOUNTER — Ambulatory Visit: Payer: BC Managed Care – PPO

## 2019-01-13 ENCOUNTER — Other Ambulatory Visit: Payer: Self-pay | Admitting: Family Medicine

## 2019-01-13 ENCOUNTER — Ambulatory Visit
Admission: RE | Admit: 2019-01-13 | Discharge: 2019-01-13 | Disposition: A | Discharge status: Home / Self Care | Payer: BC Managed Care – PPO | Source: Ambulatory Visit | Attending: Family Medicine | Admitting: Family Medicine

## 2019-01-13 DIAGNOSIS — R922 Inconclusive mammogram: Secondary | ICD-10-CM | POA: Diagnosis not present

## 2019-01-13 DIAGNOSIS — N6489 Other specified disorders of breast: Secondary | ICD-10-CM

## 2019-01-19 ENCOUNTER — Other Ambulatory Visit: Payer: BC Managed Care – PPO

## 2019-04-01 ENCOUNTER — Encounter: Payer: Self-pay | Admitting: Family Medicine

## 2019-04-02 NOTE — Telephone Encounter (Signed)
Is it for a cpe?  Is she on medicare now?   If yes  If no---- cbcd, cmp, tsh, lipid if pt agrees If on medicare --- cmp, lipid  It would be welcome to medicare

## 2019-04-06 ENCOUNTER — Ambulatory Visit (INDEPENDENT_AMBULATORY_CARE_PROVIDER_SITE_OTHER): Payer: Medicare Other | Admitting: Family Medicine

## 2019-04-06 ENCOUNTER — Other Ambulatory Visit: Payer: Self-pay

## 2019-04-06 ENCOUNTER — Encounter: Payer: Self-pay | Admitting: Family Medicine

## 2019-04-06 VITALS — BP 118/70 | HR 88 | Temp 97.8°F | Resp 18 | Ht 63.0 in | Wt 101.2 lb

## 2019-04-06 DIAGNOSIS — Z136 Encounter for screening for cardiovascular disorders: Secondary | ICD-10-CM | POA: Diagnosis not present

## 2019-04-06 DIAGNOSIS — Z0000 Encounter for general adult medical examination without abnormal findings: Secondary | ICD-10-CM

## 2019-04-06 DIAGNOSIS — M8580 Other specified disorders of bone density and structure, unspecified site: Secondary | ICD-10-CM

## 2019-04-06 LAB — LIPID PANEL
Cholesterol: 224 mg/dL — ABNORMAL HIGH (ref 0–200)
HDL: 82.1 mg/dL (ref >39.00)
LDL Cholesterol: 118 mg/dL — ABNORMAL HIGH (ref 0–99)
NonHDL: 142.3
Total CHOL/HDL Ratio: 3
Triglycerides: 124 mg/dL (ref 0.0–149.0)
VLDL: 24.8 mg/dL (ref 0.0–40.0)

## 2019-04-06 LAB — COMPREHENSIVE METABOLIC PANEL
ALT: 20 U/L (ref 0–35)
AST: 20 U/L (ref 0–37)
Albumin: 4.4 g/dL (ref 3.5–5.2)
Alkaline Phosphatase: 45 U/L (ref 39–117)
BUN: 14 mg/dL (ref 6–23)
CO2: 32 mEq/L (ref 19–32)
Calcium: 9.7 mg/dL (ref 8.4–10.5)
Chloride: 102 mEq/L (ref 96–112)
Creatinine, Ser: 0.74 mg/dL (ref 0.40–1.20)
GFR: 78.73 mL/min (ref >60.00)
Glucose, Bld: 101 mg/dL — ABNORMAL HIGH (ref 70–99)
Potassium: 5.5 mEq/L — ABNORMAL HIGH (ref 3.5–5.1)
Sodium: 139 mEq/L (ref 135–145)
Total Bilirubin: 0.5 mg/dL (ref 0.2–1.2)
Total Protein: 7 g/dL (ref 6.0–8.3)

## 2019-04-06 LAB — CBC WITH DIFFERENTIAL/PLATELET
Basophils Absolute: 0.1 10*3/uL (ref 0.0–0.1)
Basophils Relative: 1.3 % (ref 0.0–3.0)
Eosinophils Absolute: 0.1 10*3/uL (ref 0.0–0.7)
Eosinophils Relative: 1.4 % (ref 0.0–5.0)
HCT: 43.4 % (ref 36.0–46.0)
Hemoglobin: 14.6 g/dL (ref 12.0–15.0)
Lymphocytes Relative: 19.3 % (ref 12.0–46.0)
Lymphs Abs: 0.9 10*3/uL (ref 0.7–4.0)
MCHC: 33.7 g/dL (ref 30.0–36.0)
MCV: 99.6 fl (ref 78.0–100.0)
Monocytes Absolute: 0.3 10*3/uL (ref 0.1–1.0)
Monocytes Relative: 6.9 % (ref 3.0–12.0)
Neutro Abs: 3.3 10*3/uL (ref 1.4–7.7)
Neutrophils Relative %: 71.1 % (ref 43.0–77.0)
Platelets: 212 10*3/uL (ref 150.0–400.0)
RBC: 4.35 Mil/uL (ref 3.87–5.11)
RDW: 12.7 % (ref 11.5–15.5)
WBC: 4.7 10*3/uL (ref 4.0–10.5)

## 2019-04-06 NOTE — Progress Notes (Signed)
Subjective:    [name] is a 64 y.o. female who presents for a Welcome to Medicare exam.   Review of Systems  Review of Systems  Constitutional: Negative for activity change, appetite change and fatigue.  HENT: Negative for hearing loss, congestion, tinnitus and ear discharge.   Eyes: Negative for visual disturbance (see optho q1y -- vision corrected to 20/20 with glasses).  Respiratory: Negative for cough, chest tightness and shortness of breath.   Cardiovascular: Negative for chest pain, palpitations and leg swelling.  Gastrointestinal: Negative for abdominal pain, diarrhea, constipation and abdominal distention.  Genitourinary: Negative for urgency, frequency, decreased urine volume and difficulty urinating.  Musculoskeletal: Negative for back pain, arthralgias and gait problem.  Skin: Negative for color change, pallor and rash.  Neurological: Negative for dizziness, light-headedness, numbness and headaches.  Hematological: Negative for adenopathy. Does not bruise/bleed easily.  Psychiatric/Behavioral: Negative for suicidal ideas, confusion, sleep disturbance, self-injury, dysphoric mood, decreased concentration and agitation.  Pt is able to read and write and can do all ADLs No risk for falling No abuse/ violence in home           Objective:    Today's Vitals   04/06/19 0821  BP: 118/70  Pulse: 88  Resp: 18  Temp: 97.8 F (36.6 C)  TempSrc: Temporal  SpO2: 99%  Weight: 101 lb 3.2 oz (45.9 kg)  Height: 5\' 3"  (1.6 m)  Body mass index is 17.93 kg/m.  Medications Outpatient Encounter Medications as of 04/06/2019  Medication Sig  . aspirin 81 MG tablet Take 81 mg by mouth daily.  . Calcium Citrate-Vitamin D (CITRACAL PETITES/VITAMIN D) 200-250 MG-UNIT TABS   . Lactobacillus-Inulin (Cotter) CAPS   . MegaRed Omega-3 Krill Oil 500 MG CAPS   . Multiple Vitamins-Minerals (MULTIVITAMIN WITH MINERALS) tablet Take 1 tablet by mouth daily.  .  [DISCONTINUED] sulfamethoxazole-trimethoprim (BACTRIM DS) 800-160 MG tablet Take 1 tablet by mouth 2 (two) times daily.   No facility-administered encounter medications on file as of 04/06/2019.     History: History reviewed. No pertinent past medical history. Past Surgical History:  Procedure Laterality Date  . APPENDECTOMY  2012    Family History  Problem Relation Age of Onset  . Heart disease Father   . Stroke Mother   . Colon cancer Neg Hx   . Esophageal cancer Neg Hx   . Rectal cancer Neg Hx   . Stomach cancer Neg Hx    Social History   Occupational History  . Not on file  Tobacco Use  . Smoking status: Never Smoker  . Smokeless tobacco: Never Used  Substance and Sexual Activity  . Alcohol use: Yes    Alcohol/week: 3.0 standard drinks    Types: 3 Glasses of wine per week    Comment: wine daily  . Drug use: No  . Sexual activity: Yes    Birth control/protection: Post-menopausal    Tobacco Counseling Counseling given: Not Answered   Immunizations and Health Maintenance Immunization History  Administered Date(s) Administered  . Influenza,inj,Quad PF,6+ Mos 10/12/2015  . Influenza-Unspecified 10/23/2014, 10/29/2016, 10/09/2017  . Tdap 03/03/2015  . Zoster 03/03/2015  . Zoster Recombinat (Shingrix) 06/05/2016, 10/08/2016   Health Maintenance Due  Topic Date Due  . PNA vac Low Risk Adult (1 of 2 - PCV13) Never done    Activities of Daily Living In your present state of health, do you have any difficulty performing the following activities: 04/06/2019  Hearing? N  Vision? N  Difficulty  concentrating or making decisions? N  Walking or climbing stairs? N  Dressing or bathing? N  Doing errands, shopping? N  Some recent data might be hidden    Physical Exam   BP 118/70 (BP Location: Left Arm, Patient Position: Sitting, Cuff Size: Normal)   Pulse 88   Temp 97.8 F (36.6 C) (Temporal)   Resp 18   Ht 5\' 3"  (1.6 m)   Wt 101 lb 3.2 oz (45.9 kg)   SpO2 99%    BMI 17.93 kg/m  General appearance: alert, cooperative, appears stated age and no distress Head: Normocephalic, without obvious abnormality, atraumatic Eyes: negative findings: lids and lashes normal, conjunctivae and sclerae normal and pupils equal, round, reactive to light and accomodation Ears: normal TM's and external ear canals both ears Neck: no adenopathy, no carotid bruit, no JVD, supple, symmetrical, trachea midline and thyroid not enlarged, symmetric, no tenderness/mass/nodules Back: symmetric, no curvature. ROM normal. No CVA tenderness. Lungs: clear to auscultation bilaterally Breasts: normal appearance, no masses or tenderness Heart: regular rate and rhythm, S1, S2 normal, no murmur, click, rub or gallop Abdomen: soft, non-tender; bowel sounds normal; no masses,  no organomegaly Pelvic: not indicated; post-menopausal, no abnormal Pap smears in past Extremities: extremities normal, atraumatic, no cyanosis or edema Pulses: 2+ and symmetric Skin: Skin color, texture, turgor normal. No rashes or lesions Lymph nodes: Cervical, supraclavicular, and axillary nodes normal. Neurologic: Alert and oriented X 3, normal strength and tone. Normal symmetric reflexes. Normal coordination and gait  Advanced Directive      Assessment:    This is a routine wellness examination for this patient .   Vision/Hearing screen  Hearing Screening   125Hz  250Hz  500Hz  1000Hz  2000Hz  3000Hz  4000Hz  6000Hz  8000Hz   Right ear:           Left ear:           Comments: Normal whisper    Visual Acuity Screening   Right eye Left eye Both eyes  Without correction: 20/40 20/40 -1   With correction:       Dietary issues and exercise activities discussed:  Current Exercise Habits: Home exercise routine;Structured exercise class, Type of exercise: strength training/weights;walking;yoga, Time (Minutes): > 60, Frequency (Times/Week): 5, Weekly Exercise (Minutes/Week): 0, Intensity: Moderate  Goals   None     Depression Screen PHQ 2/9 Scores 04/06/2019  PHQ - 2 Score 0     Fall Risk Fall Risk  04/06/2019  Falls in the past year? 0  Number falls in past yr: 0  Injury with Fall? 0    Cognitive Function: MMSE - Mini Mental State Exam 04/06/2019  Orientation to time 5  Orientation to Place 5  Registration 3  Attention/ Calculation 5  Recall 3  Language- name 2 objects 2  Language- repeat 1  Language- follow 3 step command 3  Language- read & follow direction 1  Write a sentence 1  Copy design 1  Total score 30    ekg-- no acute changes     Patient Care Team: Carollee Herter, Alferd Apa, DO as PCP - General (Family Medicine) [place], Jennefer Bravo, MD as Referring Physician (Dermatology) Calvert Cantor, MD as Consulting Physician (Ophthalmology)     Plan:    1. Preventative health care ghm utd Check labs  See avs Discussed diet / exercise  - EKG 12-Lead - CBC with Differential/Platelet  2. Osteopenia, unspecified location con't ca and vita d Bone density utd  - CBC with Differential/Platelet  3. Ischemic heart  disease screen Check labs  - Lipid panel - Comprehensive metabolic panel  4. Encounter for Medicare annual wellness exam   I have personally reviewed and noted the following in the patient's chart:   . Medical and social history . Use of alcohol, tobacco or illicit drugs  . Current medications and supplements . Functional ability and status . Nutritional status . Physical activity . Advanced directives . List of other physicians . Hospitalizations, surgeries, and ER visits in previous 12 months . Vitals . Screenings to include cognitive, depression, and falls . Referrals and appointments  In addition, I have reviewed and discussed with patient certain preventive protocols, quality metrics, and best practice recommendations. A written personalized care plan for preventive services as well as general preventive health recommendations were provided to  patient.     [place], DO 04/07/2019

## 2019-04-06 NOTE — Patient Instructions (Addendum)
Preventive Care 40-65 Years Old, Female Preventive care refers to visits with your health care provider and lifestyle choices that can promote health and wellness. This includes:  A yearly physical exam. This may also be called an annual well check.  Regular dental visits and eye exams.  Immunizations.  Screening for certain conditions.  Healthy lifestyle choices, such as eating a healthy diet, getting regular exercise, not using drugs or products that contain nicotine and tobacco, and limiting alcohol use. What can I expect for my preventive care visit? Physical exam Your health care provider will check your:  Height and weight. This may be used to calculate body mass index (BMI), which tells if you are at a healthy weight.  Heart rate and blood pressure.  Skin for abnormal spots. Counseling Your health care provider may ask you questions about your:  Alcohol, tobacco, and drug use.  Emotional well-being.  Home and relationship well-being.  Sexual activity.  Eating habits.  Work and work Statistician.  Method of birth control.  Menstrual cycle.  Pregnancy history. What immunizations do I need?  Influenza (flu) vaccine  This is recommended every year. Tetanus, diphtheria, and pertussis (Tdap) vaccine  You may need a Td booster every 10 years. Varicella (chickenpox) vaccine  You may need this if you have not been vaccinated. Zoster (shingles) vaccine  You may need this after age 73. Measles, mumps, and rubella (MMR) vaccine  You may need at least one dose of MMR if you were born in 1957 or later. You may also need a second dose. Pneumococcal conjugate (PCV13) vaccine  You may need this if you have certain conditions and were not previously vaccinated. Pneumococcal polysaccharide (PPSV23) vaccine  You may need one or two doses if you smoke cigarettes or if you have certain conditions. Meningococcal conjugate (MenACWY) vaccine  You may need this if you  have certain conditions. Hepatitis A vaccine  You may need this if you have certain conditions or if you travel or work in places where you may be exposed to hepatitis A. Hepatitis B vaccine  You may need this if you have certain conditions or if you travel or work in places where you may be exposed to hepatitis B. Haemophilus influenzae type b (Hib) vaccine  You may need this if you have certain conditions. Human papillomavirus (HPV) vaccine  If recommended by your health care provider, you may need three doses over 6 months. You may receive vaccines as individual doses or as more than one vaccine together in one shot (combination vaccines). Talk with your health care provider about the risks and benefits of combination vaccines. What tests do I need? Blood tests  Lipid and cholesterol levels. These may be checked every 5 years, or more frequently if you are over 48 years old.  Hepatitis C test.  Hepatitis B test. Screening  Lung cancer screening. You may have this screening every year starting at age 58 if you have a 30-pack-year history of smoking and currently smoke or have quit within the past 15 years.  Colorectal cancer screening. All adults should have this screening starting at age 13 and continuing until age 58. Your health care provider may recommend screening at age 1 if you are at increased risk. You will have tests every 1-10 years, depending on your results and the type of screening test.  Diabetes screening. This is done by checking your blood sugar (glucose) after you have not eaten for a while (fasting). You may have this  done every 1-3 years.  Mammogram. This may be done every 1-2 years. Talk with your health care provider about when you should start having regular mammograms. This may depend on whether you have a family history of breast cancer.  BRCA-related cancer screening. This may be done if you have a family history of breast, ovarian, tubal, or peritoneal  cancers.  Pelvic exam and Pap test. This may be done every 3 years starting at age 52. Starting at age 67, this may be done every 5 years if you have a Pap test in combination with an HPV test. Other tests  Sexually transmitted disease (STD) testing.  Bone density scan. This is done to screen for osteoporosis. You may have this scan if you are at high risk for osteoporosis. Follow these instructions at home: Eating and drinking  Eat a diet that includes fresh fruits and vegetables, whole grains, lean protein, and low-fat dairy.  Take vitamin and mineral supplements as recommended by your health care provider.  Do not drink alcohol if: ? Your health care provider tells you not to drink. ? You are pregnant, may be pregnant, or are planning to become pregnant.  If you drink alcohol: ? Limit how much you have to 0-1 drink a day. ? Be aware of how much alcohol is in your drink. In the [place] one drink equals one 12 oz bottle of beer (355 mL), one 5 oz glass of wine (148 mL), or one 1 oz glass of hard liquor (44 mL). Lifestyle  Take daily care of your teeth and gums.  Stay active. Exercise for at least 30 minutes on 5 or more days each week.  Do not use any products that contain nicotine or tobacco, such as cigarettes, e-cigarettes, and chewing tobacco. If you need help quitting, ask your health care provider.  If you are sexually active, practice safe sex. Use a condom or other form of birth control (contraception) in order to prevent pregnancy and STIs (sexually transmitted infections).  If told by your health care provider, take low-dose aspirin daily starting at age 30. What's next?  Visit your health care provider once a year for a well check visit.  Ask your health care provider how often you should have your eyes and teeth checked.  Stay up to date on all vaccines. This information is not intended to replace advice given to you by your health care provider. Make sure you  discuss any questions you have with your health care provider. Document Revised: 09/19/2017 Document Reviewed: 09/19/2017 Elsevier Patient Education  2020 [employer].

## 2019-04-08 ENCOUNTER — Other Ambulatory Visit: Payer: Self-pay | Admitting: Family Medicine

## 2019-04-08 DIAGNOSIS — E875 Hyperkalemia: Secondary | ICD-10-CM

## 2019-04-09 ENCOUNTER — Encounter: Payer: Self-pay | Admitting: Family Medicine

## 2019-04-15 ENCOUNTER — Other Ambulatory Visit (INDEPENDENT_AMBULATORY_CARE_PROVIDER_SITE_OTHER): Payer: Medicare Other

## 2019-04-15 ENCOUNTER — Other Ambulatory Visit: Payer: Self-pay

## 2019-04-15 DIAGNOSIS — E875 Hyperkalemia: Secondary | ICD-10-CM

## 2019-04-15 LAB — BASIC METABOLIC PANEL
BUN: 13 mg/dL (ref 6–23)
CO2: 29 mEq/L (ref 19–32)
Calcium: 9.3 mg/dL (ref 8.4–10.5)
Chloride: 100 mEq/L (ref 96–112)
Creatinine, Ser: 0.73 mg/dL (ref 0.40–1.20)
GFR: 79.97 mL/min (ref >60.00)
Glucose, Bld: 98 mg/dL (ref 70–99)
Potassium: 4.6 mEq/L (ref 3.5–5.1)
Sodium: 137 mEq/L (ref 135–145)

## 2019-07-15 ENCOUNTER — Other Ambulatory Visit: Payer: BC Managed Care – PPO

## 2019-07-23 ENCOUNTER — Other Ambulatory Visit: Payer: Self-pay

## 2019-08-03 ENCOUNTER — Ambulatory Visit
Admission: RE | Admit: 2019-08-03 | Discharge: 2019-08-03 | Disposition: A | Discharge status: Home / Self Care | Payer: Medicare Other | Source: Ambulatory Visit | Attending: Family Medicine | Admitting: Family Medicine

## 2019-08-03 ENCOUNTER — Other Ambulatory Visit: Payer: Self-pay

## 2019-08-03 ENCOUNTER — Ambulatory Visit: Payer: Self-pay

## 2019-08-03 DIAGNOSIS — N6489 Other specified disorders of breast: Secondary | ICD-10-CM

## 2019-11-27 ENCOUNTER — Encounter: Payer: Self-pay | Admitting: Family Medicine

## 2020-04-11 ENCOUNTER — Ambulatory Visit: Payer: BLUE CROSS/BLUE SHIELD | Admitting: *Deleted

## 2020-04-16 ENCOUNTER — Encounter: Payer: Self-pay | Admitting: Family Medicine

## 2020-04-18 NOTE — Telephone Encounter (Signed)
If she can fast 4 hours that should be good enough or I can place orders to come in early

## 2020-04-21 ENCOUNTER — Other Ambulatory Visit: Payer: Self-pay | Admitting: Family Medicine

## 2020-04-21 ENCOUNTER — Ambulatory Visit (INDEPENDENT_AMBULATORY_CARE_PROVIDER_SITE_OTHER): Payer: Medicare Other

## 2020-04-21 ENCOUNTER — Other Ambulatory Visit (INDEPENDENT_AMBULATORY_CARE_PROVIDER_SITE_OTHER): Payer: Medicare Other

## 2020-04-21 ENCOUNTER — Other Ambulatory Visit: Payer: Self-pay

## 2020-04-21 VITALS — BP 140/86 | HR 70 | Temp 97.4°F | Resp 16 | Ht 63.0 in | Wt 103.0 lb

## 2020-04-21 DIAGNOSIS — I259 Chronic ischemic heart disease, unspecified: Secondary | ICD-10-CM

## 2020-04-21 DIAGNOSIS — Z0000 Encounter for general adult medical examination without abnormal findings: Secondary | ICD-10-CM

## 2020-04-21 DIAGNOSIS — Z1211 Encounter for screening for malignant neoplasm of colon: Secondary | ICD-10-CM | POA: Diagnosis not present

## 2020-04-21 LAB — COMPREHENSIVE METABOLIC PANEL
ALT: 21 U/L (ref 0–35)
AST: 19 U/L (ref 0–37)
Albumin: 4.5 g/dL (ref 3.5–5.2)
Alkaline Phosphatase: 42 U/L (ref 39–117)
BUN: 11 mg/dL (ref 6–23)
CO2: 32 mEq/L (ref 19–32)
Calcium: 9.4 mg/dL (ref 8.4–10.5)
Chloride: 99 mEq/L (ref 96–112)
Creatinine, Ser: 0.62 mg/dL (ref 0.40–1.20)
GFR: 92.95 mL/min (ref >60.00)
Glucose, Bld: 103 mg/dL — ABNORMAL HIGH (ref 70–99)
Potassium: 5.3 mEq/L — ABNORMAL HIGH (ref 3.5–5.1)
Sodium: 136 mEq/L (ref 135–145)
Total Bilirubin: 0.7 mg/dL (ref 0.2–1.2)
Total Protein: 6.9 g/dL (ref 6.0–8.3)

## 2020-04-21 LAB — LIPID PANEL
Cholesterol: 243 mg/dL — ABNORMAL HIGH (ref 0–200)
HDL: 92.2 mg/dL (ref >39.00)
LDL Cholesterol: 137 mg/dL — ABNORMAL HIGH (ref 0–99)
NonHDL: 150.77
Total CHOL/HDL Ratio: 3
Triglycerides: 69 mg/dL (ref 0.0–149.0)
VLDL: 13.8 mg/dL (ref 0.0–40.0)

## 2020-04-21 NOTE — Progress Notes (Signed)
Subjective:   [name] is a 65 y.o. female who presents for an Initial Medicare Annual Wellness Visit.  Review of Systems     Cardiac Risk Factors include: advanced age (>37men, >19 women);dyslipidemia     Objective:    Today's Vitals   04/21/20 1230  BP: 140/86  Pulse: 70  Resp: 16  Temp: (!) 97.4 F (36.3 C)  TempSrc: Temporal  SpO2: 97%  Weight: 103 lb (46.7 kg)  Height: 5\' 3"  (1.6 m)   Body mass index is 18.25 kg/m.  Advanced Directives 04/21/2020 04/28/2015 04/14/2015  Does Patient Have a Medical Advance Directive? No No No  Would patient like information on creating a medical advance directive? Yes (MAU/Ambulatory/Procedural Areas - Information given) - -    Current Medications (verified) Outpatient Encounter Medications as of 04/21/2020  Medication Sig  . aspirin 81 MG tablet Take 81 mg by mouth daily.  . Calcium Citrate-Vitamin D 200-250 MG-UNIT TABS   . Lactobacillus-Inulin ([place]) CAPS   . MegaRed Omega-3 Krill Oil 500 MG CAPS   . Multiple Vitamins-Minerals (MULTIVITAMIN WITH MINERALS) tablet Take 1 tablet by mouth daily.   No facility-administered encounter medications on file as of 04/21/2020.    Allergies (verified) Duricef [cefadroxil monohydrate]   History: History reviewed. No pertinent past medical history. Past Surgical History:  Procedure Laterality Date  . APPENDECTOMY  2012   Family History  Problem Relation Age of Onset  . Heart disease Father   . Stroke Mother   . Colon cancer Neg Hx   . Esophageal cancer Neg Hx   . Rectal cancer Neg Hx   . Stomach cancer Neg Hx    Social History   Socioeconomic History  . Marital status: Married    Spouse name: Not on file  . Number of children: Not on file  . Years of education: Not on file  . Highest education level: Not on file  Occupational History  . Occupation: retired  Tobacco Use  . Smoking status: Never Smoker  . Smokeless tobacco: Never Used  Substance  and Sexual Activity  . Alcohol use: Yes    Alcohol/week: 3.0 standard drinks    Types: 3 Glasses of wine per week    Comment: wine daily  . Drug use: No  . Sexual activity: Yes    Birth control/protection: Post-menopausal  Other Topics Concern  . Not on file  Social History Narrative   Exercise 5 days a week   Live at home with husband   Social Determinants of Health   Financial Resource Strain: Low Risk   . Difficulty of Paying Living Expenses: Not hard at all  Food Insecurity: No Food Insecurity  . Worried About Charity fundraiser in the Last Year: Never true  . Ran Out of Food in the Last Year: Never true  Transportation Needs: No Transportation Needs  . Lack of Transportation (Medical): No  . Lack of Transportation (Non-Medical): No  Physical Activity: Sufficiently Active  . Days of Exercise per Week: 6 days  . Minutes of Exercise per Session: 60 min  Stress: No Stress Concern Present  . Feeling of Stress : Not at all  Social Connections: Moderately Integrated  . Frequency of Communication with Friends and Family: More than three times a week  . Frequency of Social Gatherings with Friends and Family: More than three times a week  . Attends Religious Services: Never  . Active Member of Clubs or Organizations: Yes  .  Attends Archivist Meetings: More than 4 times per year  . Marital Status: Married    Tobacco Counseling Counseling given: Not Answered   Clinical Intake:  Pre-visit preparation completed: Yes  Pain : No/denies pain     Nutritional Status: BMI <19  Underweight Nutritional Risks: None Diabetes: No  How often do you need to have someone help you when you read instructions, pamphlets, or other written materials from your doctor or pharmacy?: 1 - Never  Diabetic?No  Interpreter Needed?: No  Information entered by :: Caroleen Hamman LPN   Activities of Daily Living In your present state of health, do you have any difficulty  performing the following activities: 04/21/2020  Hearing? N  Vision? N  Difficulty concentrating or making decisions? N  Walking or climbing stairs? N  Dressing or bathing? N  Doing errands, shopping? N  Preparing Food and eating ? N  Using the Toilet? N  In the past six months, have you accidently leaked urine? N  Do you have problems with loss of bowel control? N  Managing your Medications? N  Managing your Finances? N  Housekeeping or managing your Housekeeping? N  Some recent data might be hidden    Patient Care Team: Carollee Herter, Alferd Apa, DO as PCP - General (Family Medicine) [place], Jennefer Bravo, MD as Referring Physician (Dermatology) Calvert Cantor, MD as Consulting Physician (Ophthalmology)  Indicate any recent Medical Services you may have received from other than [facility] providers in the past year (date may be approximate).     Assessment:   This is a routine wellness examination for [name].  Hearing/Vision screen  Hearing Screening   125Hz  250Hz  500Hz  1000Hz  2000Hz  3000Hz  4000Hz  6000Hz  8000Hz   Right ear:           Left ear:           Comments: No issues  Vision Screening Comments: Last eye exam-04/21/2019-Dr.Patel  Dietary issues and exercise activities discussed: Current Exercise Habits: Home exercise routine, Type of exercise: strength training/weights;walking;yoga, Time (Minutes): 60, Frequency (Times/Week): 6, Weekly Exercise (Minutes/Week): 360, Intensity: Moderate, Exercise limited by: None identified  Goals    . Patient Stated     Would like to gain some weight      Depression Screen PHQ 2/9 Scores 04/21/2020 04/06/2019  PHQ - 2 Score 0 0    Fall Risk Fall Risk  04/21/2020 04/06/2019  Falls in the past year? 0 0  Number falls in past yr: 0 0  Injury with Fall? 0 0  Follow up Falls prevention discussed -    FALL RISK PREVENTION PERTAINING TO THE HOME:  Any stairs in or around the home? Yes  If so, are there any without handrails? No  Home free  of loose throw rugs in walkways, pet beds, electrical cords, etc? Yes  Adequate lighting in your home to reduce risk of falls? Yes   ASSISTIVE DEVICES UTILIZED TO PREVENT FALLS:  Life alert? No  Use of a cane, walker or w/c? No  Grab bars in the bathroom? No  Shower chair or bench in shower? No  Elevated toilet seat or a handicapped toilet? No   TIMED UP AND GO:  Was the test performed? Yes .  Length of time to ambulate 10 feet: 9 sec.   Gait steady and fast without use of assistive device  Cognitive Function:Normal cognitive status assessed by direct observation by this Nurse Health Advisor. No abnormalities found.   MMSE - Mini Mental State Exam  04/06/2019  Orientation to time 5  Orientation to Place 5  Registration 3  Attention/ Calculation 5  Recall 3  Language- name 2 objects 2  Language- repeat 1  Language- follow 3 step command 3  Language- read & follow direction 1  Write a sentence 1  Copy design 1  Total score 30        Immunizations Immunization History  Administered Date(s) Administered  . Influenza Split 10/26/2019  . Influenza,inj,Quad PF,6+ Mos 10/12/2015  . Influenza-Unspecified 10/23/2014, 10/29/2016, 10/09/2017  . PFIZER(Purple Top)SARS-COV-2 Vaccination 03/05/2019, 03/30/2019, 10/26/2019  . Tdap 03/03/2015  . Zoster 03/03/2015  . Zoster Recombinat (Shingrix) 06/05/2016, 10/08/2016    TDAP status: Up to date  Flu Vaccine status: Up to date  Pneumococcal vaccine status: Due, Education has been provided regarding the importance of this vaccine. Advised may receive this vaccine at local pharmacy or Health Dept. Aware to provide a copy of the vaccination record if obtained from local pharmacy or Health Dept. Verbalized acceptance and understanding.  Covid-19 vaccine status: Completed vaccines  Qualifies for Shingles Vaccine? No   Zostavax completed Yes   Shingrix Completed?: No.    Education has been provided regarding the importance of this  vaccine. Patient has been advised to call insurance company to determine out of pocket expense if they have not yet received this vaccine. Advised may also receive vaccine at local pharmacy or Health Dept. Verbalized acceptance and understanding.  Screening Tests Health Maintenance  Topic Date Due  . PNA vac Low Risk Adult (1 of 2 - PCV13) Never done  . COVID-19 Vaccine (4 - Booster for Pfizer series) 04/25/2020  . COLONOSCOPY (Pts 45-19yrs Insurance coverage will need to be confirmed)  04/27/2020  . MAMMOGRAM  08/02/2020  . TETANUS/TDAP  03/02/2025  . INFLUENZA VACCINE  Completed  . DEXA SCAN  Completed  . Hepatitis C Screening  Completed  . HPV VACCINES  Aged Out    Health Maintenance  Health Maintenance Due  Topic Date Due  . PNA vac Low Risk Adult (1 of 2 - PCV13) Never done  . COVID-19 Vaccine (4 - Booster for Pfizer series) 04/25/2020    Colorectal cancer screening: Referral to GI placed today. Pt aware the office will call re: appt.  Mammogram status: Completed Bilateral-08/03/2019. Repeat every year  Bone Density status: Completed 07/14/2018. Results reflect: Bone density results: OSTEOPENIA. Repeat every 2 years.  Lung Cancer Screening: (Low Dose CT Chest recommended if Age 40-80 years, 30 pack-year currently smoking OR have quit w/in 15years.) does not qualify.     Additional Screening:  Hepatitis C Screening:Completed 03/03/2015  Vision Screening: Recommended annual ophthalmology exams for early detection of glaucoma and other disorders of the eye. Is the patient up to date with their annual eye exam?  Yes  Who is the provider or what is the name of the office in which the patient attends annual eye exams? Dr. Posey Pronto   Dental Screening: Recommended annual dental exams for proper oral hygiene  Community Resource Referral / Chronic Care Management: CRR required this visit?  No   CCM required this visit?  No      Plan:     I have personally reviewed and noted  the following in the patient's chart:   . Medical and social history . Use of alcohol, tobacco or illicit drugs  . Current medications and supplements . Functional ability and status . Nutritional status . Physical activity . Advanced directives . List of other physicians .  Hospitalizations, surgeries, and ER visits in previous 12 months . Vitals . Screenings to include cognitive, depression, and falls . Referrals and appointments  In addition, I have reviewed and discussed with patient certain preventive protocols, quality metrics, and best practice recommendations. A written personalized care plan for preventive services as well as general preventive health recommendations were provided to patient.   Patient to access avs on mychart   Marta Antu, [place]   03/09/2444  Nurse Health Advisor  Nurse Notes: None

## 2020-04-21 NOTE — Patient Instructions (Signed)
[name] , Thank you for taking time to come for your Medicare Wellness Visit. I appreciate your ongoing commitment to your health goals. Please review the following plan we discussed and let me know if I can assist you in the future.   Screening recommendations/referrals: Colonoscopy: Referral ordered today. Someone will be calling you to schedule. Mammogram: Completed 08/03/2019-Due 08/02/2020 Bone Density: Completed 07/14/2018-Due 07/13/2020 Recommended yearly ophthalmology/optometry visit for glaucoma screening and checkup Recommended yearly dental visit for hygiene and checkup  Vaccinations: Influenza vaccine: Up to date Pneumococcal vaccine: Due-You may obtain vaccine at our office or your local pharmacy Tdap vaccine: Up to date-Due-03/02/2025 Shingles vaccine: Completed vaccines  Covid-19:Completed vaccines  Advanced directives: Information given today  Conditions/risks identified: See problem list  Next appointment: Follow up in one year for your annual wellness visit    Preventive Care 65 Years and Older, Female Preventive care refers to lifestyle choices and visits with your health care provider that can promote health and wellness. What does preventive care include?  A yearly physical exam. This is also called an annual well check.  Dental exams once or twice a year.  Routine eye exams. Ask your health care provider how often you should have your eyes checked.  Personal lifestyle choices, including:  Daily care of your teeth and gums.  Regular physical activity.  Eating a healthy diet.  Avoiding tobacco and drug use.  Limiting alcohol use.  Practicing safe sex.  Taking low-dose aspirin every day.  Taking vitamin and mineral supplements as recommended by your health care provider. What happens during an annual well check? The services and screenings done by your health care provider during your annual well check will depend on your age, overall health,  lifestyle risk factors, and family history of disease. Counseling  Your health care provider may ask you questions about your:  Alcohol use.  Tobacco use.  Drug use.  Emotional well-being.  Home and relationship well-being.  Sexual activity.  Eating habits.  History of falls.  Memory and ability to understand (cognition).  Work and work Statistician.  Reproductive health. Screening  You may have the following tests or measurements:  Height, weight, and BMI.  Blood pressure.  Lipid and cholesterol levels. These may be checked every 5 years, or more frequently if you are over 45 years old.  Skin check.  Lung cancer screening. You may have this screening every year starting at age 32 if you have a 30-pack-year history of smoking and currently smoke or have quit within the past 15 years.  Fecal occult blood test (FOBT) of the stool. You may have this test every year starting at age 66.  Flexible sigmoidoscopy or colonoscopy. You may have a sigmoidoscopy every 5 years or a colonoscopy every 10 years starting at age 26.  Hepatitis C blood test.  Hepatitis B blood test.  Sexually transmitted disease (STD) testing.  Diabetes screening. This is done by checking your blood sugar (glucose) after you have not eaten for a while (fasting). You may have this done every 1-3 years.  Bone density scan. This is done to screen for osteoporosis. You may have this done starting at age 63.  Mammogram. This may be done every 1-2 years. Talk to your health care provider about how often you should have regular mammograms. Talk with your health care provider about your test results, treatment options, and if necessary, the need for more tests. Vaccines  Your health care provider may recommend certain vaccines, such as:  Influenza vaccine. This is recommended every year.  Tetanus, diphtheria, and acellular pertussis (Tdap, Td) vaccine. You may need a Td booster every 10 years.  Zoster  vaccine. You may need this after age 61.  Pneumococcal 13-valent conjugate (PCV13) vaccine. One dose is recommended after age 52.  Pneumococcal polysaccharide (PPSV23) vaccine. One dose is recommended after age 66. Talk to your health care provider about which screenings and vaccines you need and how often you need them. This information is not intended to replace advice given to you by your health care provider. Make sure you discuss any questions you have with your health care provider. Document Released: 02/04/2015 Document Revised: 09/28/2015 Document Reviewed: 11/09/2014 Elsevier Interactive Patient Education  2017 [employer] Prevention in the Home Falls can cause injuries. They can happen to people of all ages. There are many things you can do to make your home safe and to help prevent falls. What can I do on the outside of my home?  Regularly fix the edges of walkways and driveways and fix any cracks.  Remove anything that might make you trip as you walk through a door, such as a raised step or threshold.  Trim any bushes or trees on the path to your home.  Use bright outdoor lighting.  Clear any walking paths of anything that might make someone trip, such as rocks or tools.  Regularly check to see if handrails are loose or broken. Make sure that both sides of any steps have handrails.  Any raised decks and porches should have guardrails on the edges.  Have any leaves, snow, or ice cleared regularly.  Use sand or salt on walking paths during winter.  Clean up any spills in your garage right away. This includes oil or grease spills. What can I do in the bathroom?  Use night lights.  Install grab bars by the toilet and in the tub and shower. Do not use towel bars as grab bars.  Use non-skid mats or decals in the tub or shower.  If you need to sit down in the shower, use a plastic, non-slip stool.  Keep the floor dry. Clean up any water that spills on the  floor as soon as it happens.  Remove soap buildup in the tub or shower regularly.  Attach bath mats securely with double-sided non-slip rug tape.  Do not have throw rugs and other things on the floor that can make you trip. What can I do in the bedroom?  Use night lights.  Make sure that you have a light by your bed that is easy to reach.  Do not use any sheets or blankets that are too big for your bed. They should not hang down onto the floor.  Have a firm chair that has side arms. You can use this for support while you get dressed.  Do not have throw rugs and other things on the floor that can make you trip. What can I do in the kitchen?  Clean up any spills right away.  Avoid walking on wet floors.  Keep items that you use a lot in easy-to-reach places.  If you need to reach something above you, use a strong step stool that has a grab bar.  Keep electrical cords out of the way.  Do not use floor polish or wax that makes floors slippery. If you must use wax, use non-skid floor wax.  Do not have throw rugs and other things on the floor that  can make you trip. What can I do with my stairs?  Do not leave any items on the stairs.  Make sure that there are handrails on both sides of the stairs and use them. Fix handrails that are broken or loose. Make sure that handrails are as long as the stairways.  Check any carpeting to make sure that it is firmly attached to the stairs. Fix any carpet that is loose or worn.  Avoid having throw rugs at the top or bottom of the stairs. If you do have throw rugs, attach them to the floor with carpet tape.  Make sure that you have a light switch at the top of the stairs and the bottom of the stairs. If you do not have them, ask someone to add them for you. What else can I do to help prevent falls?  Wear shoes that:  Do not have high heels.  Have rubber bottoms.  Are comfortable and fit you well.  Are closed at the toe. Do not wear  sandals.  If you use a stepladder:  Make sure that it is fully opened. Do not climb a closed stepladder.  Make sure that both sides of the stepladder are locked into place.  Ask someone to hold it for you, if possible.  Clearly mark and make sure that you can see:  Any grab bars or handrails.  First and last steps.  Where the edge of each step is.  Use tools that help you move around (mobility aids) if they are needed. These include:  Canes.  Walkers.  Scooters.  Crutches.  Turn on the lights when you go into a dark area. Replace any light bulbs as soon as they burn out.  Set up your furniture so you have a clear path. Avoid moving your furniture around.  If any of your floors are uneven, fix them.  If there are any pets around you, be aware of where they are.  Review your medicines with your doctor. Some medicines can make you feel dizzy. This can increase your chance of falling. Ask your doctor what other things that you can do to help prevent falls. This information is not intended to replace advice given to you by your health care provider. Make sure you discuss any questions you have with your health care provider. Document Released: 11/04/2008 Document Revised: 06/16/2015 Document Reviewed: 02/12/2014 Elsevier Interactive Patient Education  2017 [employer].

## 2020-04-28 ENCOUNTER — Encounter: Payer: Self-pay | Admitting: Family Medicine

## 2020-04-28 NOTE — Telephone Encounter (Signed)
She needs to keep app

## 2020-05-05 ENCOUNTER — Ambulatory Visit (INDEPENDENT_AMBULATORY_CARE_PROVIDER_SITE_OTHER): Payer: Medicare Other | Admitting: Family Medicine

## 2020-05-05 ENCOUNTER — Other Ambulatory Visit: Payer: Self-pay

## 2020-05-05 ENCOUNTER — Encounter: Payer: Self-pay | Admitting: Family Medicine

## 2020-05-05 VITALS — BP 122/74 | HR 81 | Temp 97.6°F | Resp 18 | Ht 63.0 in | Wt 100.6 lb

## 2020-05-05 DIAGNOSIS — I259 Chronic ischemic heart disease, unspecified: Secondary | ICD-10-CM | POA: Diagnosis not present

## 2020-05-05 DIAGNOSIS — R636 Underweight: Secondary | ICD-10-CM | POA: Insufficient documentation

## 2020-05-05 DIAGNOSIS — E2839 Other primary ovarian failure: Secondary | ICD-10-CM

## 2020-05-05 DIAGNOSIS — E785 Hyperlipidemia, unspecified: Secondary | ICD-10-CM | POA: Diagnosis not present

## 2020-05-05 DIAGNOSIS — Z23 Encounter for immunization: Secondary | ICD-10-CM

## 2020-05-05 NOTE — Patient Instructions (Signed)

## 2020-05-05 NOTE — Assessment & Plan Note (Signed)
Encouraged heart healthy diet, increase exercise, avoid trans fats, consider a krill oil cap daily 

## 2020-05-05 NOTE — Progress Notes (Signed)
Subjective:   By signing my name below, I, [name], attest that this documentation has been prepared under the direction and in the presence of Dr. Roma Schanz R. 05/05/2020   Patient ID: [name], female    DOB: 11-14-1955, 65 y.o.   MRN: [id number]  Chief Complaint  Patient presents with  . Follow-up    Concerns/ questions: pt wanted to discuss the covid booster and Pna vaccines. Due dexa and mamm in May Has her Colonoscopy scheduled. Wanted to mention that she has been trying to gain weight and this has not been going well with her cholesterol.  BP was high at last OV- has been good at home- she has readings. -tlc,rma    HPI Patient is in today for a office visit to follow up on blood pressure. She brought in her blood pressure log. Her blood pressure log ranges from 128-110/86-78. She does not salt her foods.  BP Readings from Last 3 Encounters:  05/05/20 122/74  04/21/20 140/86  04/06/19 118/70   She is concerned about her elevated cholesterol levels, because she has been trying to increase her weight. She relates this to her eating more processed foods.She was trying to increase her calories intake. She has never seen a nutritionist but is willing to attend a referral appointment. She is willing to get her pneumonia vaccine. She is UTD on her covid vaccines.  No past medical history on file.  Past Surgical History:  Procedure Laterality Date  . APPENDECTOMY  2012    Family History  Problem Relation Age of Onset  . Heart disease Father   . Stroke Mother   . Colon cancer Neg Hx   . Esophageal cancer Neg Hx   . Rectal cancer Neg Hx   . Stomach cancer Neg Hx     Social History   Socioeconomic History  . Marital status: Married    Spouse name: Not on file  . Number of children: Not on file  . Years of education: Not on file  . Highest education level: Not on file  Occupational History  . Occupation: retired  Tobacco Use  . Smoking status:  Never Smoker  . Smokeless tobacco: Never Used  Substance and Sexual Activity  . Alcohol use: Yes    Alcohol/week: 3.0 standard drinks    Types: 3 Glasses of wine per week    Comment: wine daily  . Drug use: No  . Sexual activity: Yes    Birth control/protection: Post-menopausal  Other Topics Concern  . Not on file  Social History Narrative   Exercise 5 days a week   Live at home with husband   Social Determinants of Health   Financial Resource Strain: Low Risk   . Difficulty of Paying Living Expenses: Not hard at all  Food Insecurity: No Food Insecurity  . Worried About Charity fundraiser in the Last Year: Never true  . Ran Out of Food in the Last Year: Never true  Transportation Needs: No Transportation Needs  . Lack of Transportation (Medical): No  . Lack of Transportation (Non-Medical): No  Physical Activity: Sufficiently Active  . Days of Exercise per Week: 6 days  . Minutes of Exercise per Session: 60 min  Stress: No Stress Concern Present  . Feeling of Stress : Not at all  Social Connections: Moderately Integrated  . Frequency of Communication with Friends and Family: More than three times a week  . Frequency of Social Gatherings with Friends and  Family: More than three times a week  . Attends Religious Services: Never  . Active Member of Clubs or Organizations: Yes  . Attends Archivist Meetings: More than 4 times per year  . Marital Status: Married  Human resources officer Violence: Not At Risk  . Fear of Current or Ex-Partner: No  . Emotionally Abused: No  . Physically Abused: No  . Sexually Abused: No    Outpatient Medications Prior to Visit  Medication Sig Dispense Refill  . aspirin 81 MG tablet Take 81 mg by mouth daily.    . Calcium Citrate-Vitamin D 200-250 MG-UNIT TABS     . Lactobacillus-Inulin ([place]) CAPS     . MegaRed Omega-3 Krill Oil 500 MG CAPS     . Multiple Vitamins-Minerals (MULTIVITAMIN WITH MINERALS) tablet Take 1  tablet by mouth daily.     No facility-administered medications prior to visit.    Allergies  Allergen Reactions  . Duricef [Cefadroxil Monohydrate] Rash    Review of Systems  Constitutional: Negative for chills, fever and weight loss.       (+)Weight management  HENT: Negative for congestion, ear pain, sinus pain and sore throat.   Eyes: Negative for pain.  Respiratory: Negative for cough, shortness of breath and wheezing.   Cardiovascular: Negative for chest pain, palpitations and leg swelling.  Gastrointestinal: Negative for abdominal pain, blood in stool, diarrhea, nausea and vomiting.  Genitourinary: Negative for dysuria, frequency, hematuria and urgency.  Skin: Negative for rash.  Neurological: Negative for dizziness and headaches.       Objective:    Physical Exam Vitals and nursing note reviewed.  Constitutional:      General: She is not in acute distress.    Appearance: Normal appearance. She is not ill-appearing.  HENT:     Head: Normocephalic and atraumatic.     Right Ear: External ear normal.     Left Ear: External ear normal.  Eyes:     Extraocular Movements: Extraocular movements intact.     Pupils: Pupils are equal, round, and reactive to light.  Cardiovascular:     Rate and Rhythm: Normal rate and regular rhythm.     Pulses: Normal pulses.     Heart sounds: Normal heart sounds. No murmur heard.   Pulmonary:     Effort: Pulmonary effort is normal. No respiratory distress.     Breath sounds: Normal breath sounds. No wheezing, rhonchi or rales.  Neurological:     Mental Status: She is alert and oriented to person, place, and time.  Psychiatric:        Behavior: Behavior normal.     BP 122/74 (BP Location: Left Arm, Patient Position: Sitting, Cuff Size: Normal)   Pulse 81   Temp 97.6 F (36.4 C) (Oral)   Resp 18   Ht 5\' 3"  (1.6 m)   Wt 100 lb 9.6 oz (45.6 kg)   SpO2 100%   BMI 17.82 kg/m  Wt Readings from Last 3 Encounters:  05/05/20 100 lb  9.6 oz (45.6 kg)  04/21/20 103 lb (46.7 kg)  04/06/19 101 lb 3.2 oz (45.9 kg)    Diabetic Foot Exam - Simple   No data filed    Lab Results  Component Value Date   WBC 4.7 04/06/2019   HGB 14.6 04/06/2019   HCT 43.4 04/06/2019   PLT 212.0 04/06/2019   GLUCOSE 103 (H) 04/21/2020   CHOL 243 (H) 04/21/2020   TRIG 69.0 04/21/2020   HDL 92.20 04/21/2020  LDLCALC 137 (H) 04/21/2020   ALT 21 04/21/2020   AST 19 04/21/2020   NA 136 04/21/2020   K 5.3 No hemolysis seen (H) 04/21/2020   CL 99 04/21/2020   CREATININE 0.62 04/21/2020   BUN 11 04/21/2020   CO2 32 04/21/2020   TSH 2.67 03/31/2018    Lab Results  Component Value Date   TSH 2.67 03/31/2018   Lab Results  Component Value Date   WBC 4.7 04/06/2019   HGB 14.6 04/06/2019   HCT 43.4 04/06/2019   MCV 99.6 04/06/2019   PLT 212.0 04/06/2019   Lab Results  Component Value Date   NA 136 04/21/2020   K 5.3 No hemolysis seen (H) 04/21/2020   CO2 32 04/21/2020   GLUCOSE 103 (H) 04/21/2020   BUN 11 04/21/2020   CREATININE 0.62 04/21/2020   BILITOT 0.7 04/21/2020   ALKPHOS 42 04/21/2020   AST 19 04/21/2020   ALT 21 04/21/2020   PROT 6.9 04/21/2020   ALBUMIN 4.5 04/21/2020   CALCIUM 9.4 04/21/2020   GFR 92.95 04/21/2020   Lab Results  Component Value Date   CHOL 243 (H) 04/21/2020   Lab Results  Component Value Date   HDL 92.20 04/21/2020   Lab Results  Component Value Date   LDLCALC 137 (H) 04/21/2020   Lab Results  Component Value Date   TRIG 69.0 04/21/2020   Lab Results  Component Value Date   CHOLHDL 3 04/21/2020   No results found for: HGBA1C      Assessment & Plan:   Problem List Items Addressed This Visit      Unprioritized   Hyperlipidemia - Primary    Encouraged heart healthy diet, increase exercise, avoid trans fats, consider a krill oil cap daily      Relevant Orders   Lipid panel   Comprehensive metabolic panel   Underweight    Refer to nutrition        Other Visit  Diagnoses    Need for pneumococcal vaccination       Relevant Orders   Pneumococcal polysaccharide vaccine 23-valent greater than or equal to 2yo subcutaneous/IM (Completed)   Estrogen deficiency       Relevant Orders   DG Bone Density      No orders of the defined types were placed in this encounter.   IAnn Held, DO, personally preformed the services described in this documentation.  All medical record entries made by the scribe were at my direction and in my presence.  I have reviewed the chart and discharge instructions (if applicable) and agree that the record reflects my personal performance and is accurate and complete. 05/05/2020   I,[name],acting as a scribe for Ann Held, DO.,have documented all relevant documentation on the behalf of Ann Held, DO,as directed by  Ann Held, DO while in the presence of [place], DO, have reviewed all documentation for this visit. The documentation on 05/05/20 for the exam, diagnosis, procedures, and orders are all accurate and complete.   Ann Held, DO

## 2020-05-05 NOTE — Assessment & Plan Note (Signed)
Refer to nutrition.

## 2020-05-25 ENCOUNTER — Encounter: Payer: Self-pay | Admitting: Family Medicine

## 2020-05-25 ENCOUNTER — Other Ambulatory Visit: Payer: Self-pay | Admitting: Family Medicine

## 2020-05-25 DIAGNOSIS — Z1231 Encounter for screening mammogram for malignant neoplasm of breast: Secondary | ICD-10-CM

## 2020-05-25 NOTE — Telephone Encounter (Signed)
Noted  

## 2020-08-02 ENCOUNTER — Other Ambulatory Visit: Payer: Self-pay

## 2020-08-02 ENCOUNTER — Ambulatory Visit (AMBULATORY_SURGERY_CENTER): Payer: Medicare Other | Admitting: *Deleted

## 2020-08-02 VITALS — Ht 63.0 in | Wt 98.4 lb

## 2020-08-02 DIAGNOSIS — Z86010 Personal history of colonic polyps: Secondary | ICD-10-CM

## 2020-08-02 MED ORDER — PLENVU 140 G PO SOLR: 1.0000 | Freq: Once | ORAL | 0 refills | Status: AC

## 2020-08-02 NOTE — Progress Notes (Signed)
  No trouble with anesthesia, denies being told they were difficult to intubate, or hx/fam hx of malignant hyperthermia per pt   No egg or soy allergy  No home oxygen use   No medications for weight loss taken  Pt denies constipation issues  Pt informed that we do not do prior authorizations for prep  [employer] Medicare coupon given to pt   

## 2020-08-03 ENCOUNTER — Other Ambulatory Visit (INDEPENDENT_AMBULATORY_CARE_PROVIDER_SITE_OTHER): Payer: Medicare Other

## 2020-08-03 DIAGNOSIS — E785 Hyperlipidemia, unspecified: Secondary | ICD-10-CM

## 2020-08-03 LAB — COMPREHENSIVE METABOLIC PANEL
ALT: 27 U/L (ref 0–35)
AST: 23 U/L (ref 0–37)
Albumin: 4.5 g/dL (ref 3.5–5.2)
Alkaline Phosphatase: 44 U/L (ref 39–117)
BUN: 12 mg/dL (ref 6–23)
CO2: 31 mEq/L (ref 19–32)
Calcium: 9.6 mg/dL (ref 8.4–10.5)
Chloride: 99 mEq/L (ref 96–112)
Creatinine, Ser: 0.71 mg/dL (ref 0.40–1.20)
GFR: 88.57 mL/min (ref >60.00)
Glucose, Bld: 92 mg/dL (ref 70–99)
Potassium: 5 mEq/L (ref 3.5–5.1)
Sodium: 136 mEq/L (ref 135–145)
Total Bilirubin: 0.7 mg/dL (ref 0.2–1.2)
Total Protein: 6.8 g/dL (ref 6.0–8.3)

## 2020-08-03 LAB — LIPID PANEL
Cholesterol: 225 mg/dL — ABNORMAL HIGH (ref 0–200)
HDL: 87.8 mg/dL (ref >39.00)
LDL Cholesterol: 127 mg/dL — ABNORMAL HIGH (ref 0–99)
NonHDL: 137.19
Total CHOL/HDL Ratio: 3
Triglycerides: 50 mg/dL (ref 0.0–149.0)
VLDL: 10 mg/dL (ref 0.0–40.0)

## 2020-08-09 ENCOUNTER — Encounter: Payer: Medicare Other | Admitting: Family Medicine

## 2020-08-09 ENCOUNTER — Other Ambulatory Visit: Payer: Self-pay

## 2020-08-09 NOTE — Progress Notes (Signed)
Subjective:   By signing my name below, I, [name], attest that this documentation has been prepared under the direction and in the presence of Dr. Roma Schanz, DO. 08/09/2020   Patient ID: [name], female    DOB: 28-Dec-1955, 65 y.o.   MRN: [id number]  No chief complaint on file.   HPI Patient is in today for a comprehensive physical exam.    Past Medical History:  Diagnosis Date   Hyperlipidemia    Osteopenia     Past Surgical History:  Procedure Laterality Date   APPENDECTOMY  01/22/2010   COLONOSCOPY      Family History  Problem Relation Age of Onset   Heart disease Father    Stroke Mother    Colon cancer Neg Hx    Esophageal cancer Neg Hx    Rectal cancer Neg Hx    Stomach cancer Neg Hx     Social History   Socioeconomic History   Marital status: Married    Spouse name: Not on file   Number of children: Not on file   Years of education: Not on file   Highest education level: Not on file  Occupational History   Occupation: retired  Tobacco Use   Smoking status: Never   Smokeless tobacco: Never  Vaping Use   Vaping Use: Never used  Substance and Sexual Activity   Alcohol use: Yes    Alcohol/week: 3.0 standard drinks    Types: 3 Glasses of wine per week    Comment: wine daily   Drug use: No   Sexual activity: Yes    Birth control/protection: Post-menopausal  Other Topics Concern   Not on file  Social History Narrative   Exercise 5 days a week   Live at home with husband   Social Determinants of Radio broadcast assistant Strain: Low Risk    Difficulty of Paying Living Expenses: Not hard at all  Food Insecurity: No Food Insecurity   Worried About Charity fundraiser in the Last Year: Never true   Arboriculturist in the Last Year: Never true  Transportation Needs: No Transportation Needs   Lack of Transportation (Medical): No   Lack of Transportation (Non-Medical): No  Physical Activity: Sufficiently Active   Days of  Exercise per Week: 6 days   Minutes of Exercise per Session: 60 min  Stress: No Stress Concern Present   Feeling of Stress : Not at all  Social Connections: Moderately Integrated   Frequency of Communication with Friends and Family: More than three times a week   Frequency of Social Gatherings with Friends and Family: More than three times a week   Attends Religious Services: Never   Marine scientist or Organizations: Yes   Attends Music therapist: More than 4 times per year   Marital Status: Married  Human resources officer Violence: Not At Risk   Fear of Current or Ex-Partner: No   Emotionally Abused: No   Physically Abused: No   Sexually Abused: No    Outpatient Medications Prior to Visit  Medication Sig Dispense Refill   aspirin 81 MG tablet Take 81 mg by mouth daily.     Calcium Citrate-Vitamin D 200-250 MG-UNIT TABS      Lactobacillus-Inulin ([facility]) CAPS      MegaRed Omega-3 Krill Oil 500 MG CAPS      Multiple Vitamins-Minerals (MULTIVITAMIN WITH MINERALS) tablet Take 1 tablet by mouth daily.  No facility-administered medications prior to visit.    Allergies  Allergen Reactions   Duricef [Cefadroxil Monohydrate] Rash    ROS     Objective:    Physical Exam Constitutional:      General: She is not in acute distress.    Appearance: Normal appearance. She is not ill-appearing.  HENT:     Head: Normocephalic and atraumatic.     Right Ear: Tympanic membrane, ear canal and external ear normal.     Left Ear: Tympanic membrane, ear canal and external ear normal.  Eyes:     Extraocular Movements: Extraocular movements intact.     Pupils: Pupils are equal, round, and reactive to light.  Cardiovascular:     Rate and Rhythm: Normal rate and regular rhythm.     Pulses: Normal pulses.     Heart sounds: Normal heart sounds. No murmur heard.   No gallop.  Pulmonary:     Effort: Pulmonary effort is normal. No respiratory distress.      Breath sounds: Normal breath sounds. No wheezing, rhonchi or rales.  Abdominal:     General: Bowel sounds are normal. There is no distension.     Palpations: Abdomen is soft. There is no mass.     Tenderness: There is no abdominal tenderness. There is no guarding or rebound.     Hernia: No hernia is present.  Skin:    General: Skin is warm and dry.  Neurological:     Mental Status: She is alert and oriented to person, place, and time.  Psychiatric:        Behavior: Behavior normal.    There were no vitals taken for this visit. Wt Readings from Last 3 Encounters:  08/02/20 98 lb 6.4 oz (44.6 kg)  05/05/20 100 lb 9.6 oz (45.6 kg)  04/21/20 103 lb (46.7 kg)    Diabetic Foot Exam - Simple   No data filed    Lab Results  Component Value Date   WBC 4.7 04/06/2019   HGB 14.6 04/06/2019   HCT 43.4 04/06/2019   PLT 212.0 04/06/2019   GLUCOSE 92 08/03/2020   CHOL 225 (H) 08/03/2020   TRIG 50.0 08/03/2020   HDL 87.80 08/03/2020   LDLCALC 127 (H) 08/03/2020   ALT 27 08/03/2020   AST 23 08/03/2020   NA 136 08/03/2020   K 5.0 08/03/2020   CL 99 08/03/2020   CREATININE 0.71 08/03/2020   BUN 12 08/03/2020   CO2 31 08/03/2020   TSH 2.67 03/31/2018    Lab Results  Component Value Date   TSH 2.67 03/31/2018   Lab Results  Component Value Date   WBC 4.7 04/06/2019   HGB 14.6 04/06/2019   HCT 43.4 04/06/2019   MCV 99.6 04/06/2019   PLT 212.0 04/06/2019   Lab Results  Component Value Date   NA 136 08/03/2020   K 5.0 08/03/2020   CO2 31 08/03/2020   GLUCOSE 92 08/03/2020   BUN 12 08/03/2020   CREATININE 0.71 08/03/2020   BILITOT 0.7 08/03/2020   ALKPHOS 44 08/03/2020   AST 23 08/03/2020   ALT 27 08/03/2020   PROT 6.8 08/03/2020   ALBUMIN 4.5 08/03/2020   CALCIUM 9.6 08/03/2020   GFR 88.57 08/03/2020   Lab Results  Component Value Date   CHOL 225 (H) 08/03/2020   Lab Results  Component Value Date   HDL 87.80 08/03/2020   Lab Results  Component Value Date    LDLCALC 127 (H) 08/03/2020   Lab  Results  Component Value Date   TRIG 50.0 08/03/2020   Lab Results  Component Value Date   CHOLHDL 3 08/03/2020   No results found for: HGBA1C  Mammogram- Last completed 08/03/2019. Results showed probable benign area of subtle asymmetry in right breast, stable for one year, otherwise results normal. Repeat in 1 year.  Pap Smear- Last completed 03/31/2018. Results normal.  Colonoscopy- Last completed 04/28/2015. Results showed one 6 mm polyp in transverse colon removed with cold snare, internal hemorrhoids and hypertrophied anal papilae, otherwise results are normal.  Dexa- Last completed 07/14/2018. Results showed she is osteopenic. Repeat in 2 years.      Assessment & Plan:   Problem List Items Addressed This Visit   None    No orders of the defined types were placed in this encounter.   I, Dr. Roma Schanz, DO, personally preformed the services described in this documentation.  All medical record entries made by the scribe were at my direction and in my presence.  I have reviewed the chart and discharge instructions (if applicable) and agree that the record reflects my personal performance and is accurate and complete. 08/09/2020   I,[name],acting as a Education administrator for [employer], DO.,have documented all relevant documentation on the behalf of Ann Held, DO,as directed by  Ann Held, DO while in the presence of Ann Held, DO.   Shehryar [employer]

## 2020-08-16 ENCOUNTER — Ambulatory Visit (AMBULATORY_SURGERY_CENTER): Payer: Medicare Other | Admitting: Internal Medicine

## 2020-08-16 ENCOUNTER — Other Ambulatory Visit: Payer: Self-pay

## 2020-08-16 ENCOUNTER — Encounter: Payer: Self-pay | Admitting: Internal Medicine

## 2020-08-16 VITALS — BP 92/58 | HR 62 | Temp 96.4°F | Resp 12 | Ht 63.0 in | Wt 98.4 lb

## 2020-08-16 DIAGNOSIS — Z86010 Personal history of colonic polyps: Secondary | ICD-10-CM

## 2020-08-16 MED ORDER — SODIUM CHLORIDE 0.9 % IV SOLN: 500.0000 mL | Freq: Once | INTRAVENOUS | Status: DC

## 2020-08-16 NOTE — Progress Notes (Signed)
Report to PACU, RN, vss, BBS= Clear.  

## 2020-08-16 NOTE — Progress Notes (Signed)
Pt's states no medical or surgical changes since previsit or office visit.   Check-in-JB  V/S-[place]

## 2020-08-16 NOTE — Op Note (Signed)
[facility] Patient Name: [name] Procedure Date: 08/16/2020 7:33 AM MRN: [id number] Endoscopist: Jerene Bears , MD Age: 65 Referring MD:  Date of Birth: Dec 16, 1955 Gender: Female Account #: [id number] Procedure:                Colonoscopy Indications:              High risk colon cancer surveillance: Personal                            history of non-advanced adenoma, Last colonoscopy:                            April 2017 Medicines:                Monitored Anesthesia Care Procedure:                Pre-Anesthesia Assessment:                           - Prior to the procedure, a History and Physical                            was performed, and patient medications and                            allergies were reviewed. The patient's tolerance of                            previous anesthesia was also reviewed. The risks                            and benefits of the procedure and the sedation                            options and risks were discussed with the patient.                            All questions were answered, and informed consent                            was obtained. Prior Anticoagulants: The patient has                            taken no previous anticoagulant or antiplatelet                            agents. ASA Grade Assessment: II - A patient with                            mild systemic disease. After reviewing the risks                            and benefits, the patient was deemed in  satisfactory condition to undergo the procedure.                           After obtaining informed consent, the colonoscope                            was passed under direct vision. Throughout the                            procedure, the patient's blood pressure, pulse, and                            oxygen saturations were monitored continuously. The                            0441 PCF-H190TL Slim SB Colonoscope was introduced                             through the anus and advanced to the cecum,                            identified by appendiceal orifice and ileocecal                            valve. The colonoscopy was performed without                            difficulty. The patient tolerated the procedure                            well. The quality of the bowel preparation was                            good. The ileocecal valve, appendiceal orifice, and                            rectum were photographed. Scope In: 8:11:25 AM Scope Out: 8:29:45 AM Scope Withdrawal Time: 0 hours 13 minutes 16 seconds  Total Procedure Duration: 0 hours 18 minutes 20 seconds  Findings:                 The digital rectal exam was normal.                           The entire examined colon appeared normal.                           Internal hemorrhoids were found during                            retroflexion. The hemorrhoids were small. Complications:            No immediate complications. Estimated Blood Loss:     Estimated blood loss: none. Impression:               - The entire examined colon is  normal.                           - Internal hemorrhoids.                           - No specimens collected. Recommendation:           - Patient has a contact number available for                            emergencies. The signs and symptoms of potential                            delayed complications were discussed with the                            patient. Return to normal activities tomorrow.                            Written discharge instructions were provided to the                            patient.                           - Resume previous diet.                           - Continue present medications.                           - Repeat colonoscopy in 10 years for surveillance. Jerene Bears, MD 08/16/2020 8:33:01 AM This report has been signed electronically.

## 2020-08-16 NOTE — Patient Instructions (Signed)
Resume previous diet Continue current medications Repeat colonoscopy in 10 years!!!!!!  YOU HAD AN ENDOSCOPIC PROCEDURE TODAY AT [facility]:   Refer to the procedure report that was given to you for any specific questions about what was found during the examination.  If the procedure report does not answer your questions, please call your gastroenterologist to clarify.  If you requested that your care partner not be given the details of your procedure findings, then the procedure report has been included in a sealed envelope for you to review at your convenience later.  YOU SHOULD EXPECT: Some feelings of bloating in the abdomen. Passage of more gas than usual.  Walking can help get rid of the air that was put into your GI tract during the procedure and reduce the bloating. If you had a lower endoscopy (such as a colonoscopy or flexible sigmoidoscopy) you may notice spotting of blood in your stool or on the toilet paper. If you underwent a bowel prep for your procedure, you may not have a normal bowel movement for a few days.  Please Note:  You might notice some irritation and congestion in your nose or some drainage.  This is from the oxygen used during your procedure.  There is no need for concern and it should clear up in a day or so.  SYMPTOMS TO REPORT IMMEDIATELY:  Following lower endoscopy (colonoscopy or flexible sigmoidoscopy):  Excessive amounts of blood in the stool  Significant tenderness or worsening of abdominal pains  Swelling of the abdomen that is new, acute  Fever of 100F or higher  For urgent or emergent issues, a gastroenterologist can be reached at any hour by calling [phone]. Do not use MyChart messaging for urgent concerns.   DIET:  We do recommend a small meal at first, but then you may proceed to your regular diet.  Drink plenty of fluids but you should avoid alcoholic beverages for 24 hours.  ACTIVITY:  You should plan to take it easy for the  rest of today and you should NOT DRIVE or use heavy machinery until tomorrow (because of the sedation medicines used during the test).    FOLLOW UP: Our staff will call the number listed on your records 48-72 hours following your procedure to check on you and address any questions or concerns that you may have regarding the information given to you following your procedure. If we do not reach you, we will leave a message.  We will attempt to reach you two times.  During this call, we will ask if you have developed any symptoms of COVID 19. If you develop any symptoms (ie: fever, flu-like symptoms, shortness of breath, cough etc.) before then, please call [phone].  If you test positive for Covid 19 in the 2 weeks post procedure, please call and report this information to [place].    If any biopsies were taken you will be contacted by phone or by letter within the next 1-3 weeks.  Please call us at [phone] if you have not heard about the biopsies in 3 weeks.   SIGNATURES/CONFIDENTIALITY: You and/or your care partner have signed paperwork which will be entered into your electronic medical record.  These signatures attest to the fact that that the information above on your After Visit Summary has been reviewed and is understood.  Full responsibility of the confidentiality of this discharge information lies with you and/or your care-partner.

## 2020-08-18 ENCOUNTER — Telehealth: Payer: Self-pay | Admitting: *Deleted

## 2020-08-18 NOTE — Telephone Encounter (Signed)
  Follow up Call-  Call back number 08/16/2020  Post procedure Call Back phone  # [phone]  Permission to leave phone message Yes  Some recent data might be hidden     Patient questions:  Do you have a fever, pain , or abdominal swelling? No. Pain Score  0 *  Have you tolerated food without any problems? Yes.    Have you been able to return to your normal activities? Yes.    Do you have any questions about your discharge instructions: Diet   No. Medications  No. Follow up visit  No.  Do you have questions or concerns about your Care? No.  Actions: * If pain score is 4 or above: No action needed, pain <4.  Have you developed a fever since your procedure? no  2.   Have you had an respiratory symptoms (SOB or cough) since your procedure? no  3.   Have you tested positive for COVID 19 since your procedure no  4.   Have you had any family members/close contacts diagnosed with the COVID 19 since your procedure?  no   If yes to any of these questions please route to Joylene John, RN and Joella Prince, RN

## 2020-08-19 ENCOUNTER — Other Ambulatory Visit: Payer: Self-pay | Admitting: Family Medicine

## 2020-08-19 DIAGNOSIS — N6489 Other specified disorders of breast: Secondary | ICD-10-CM

## 2020-08-24 ENCOUNTER — Ambulatory Visit
Admission: RE | Admit: 2020-08-24 | Discharge: 2020-08-24 | Disposition: A | Discharge status: Home / Self Care | Payer: Medicare Other | Source: Ambulatory Visit | Attending: Family Medicine | Admitting: Family Medicine

## 2020-08-24 ENCOUNTER — Other Ambulatory Visit: Payer: Self-pay

## 2020-08-24 DIAGNOSIS — N6489 Other specified disorders of breast: Secondary | ICD-10-CM

## 2020-09-26 ENCOUNTER — Encounter: Payer: Self-pay | Admitting: Internal Medicine

## 2020-10-05 ENCOUNTER — Encounter: Payer: Self-pay | Admitting: Family Medicine

## 2020-10-28 ENCOUNTER — Other Ambulatory Visit: Payer: Self-pay | Admitting: Family Medicine

## 2020-10-28 ENCOUNTER — Telehealth: Payer: Self-pay | Admitting: *Deleted

## 2020-10-28 DIAGNOSIS — E785 Hyperlipidemia, unspecified: Secondary | ICD-10-CM

## 2020-10-28 NOTE — Telephone Encounter (Signed)
Thank you :)

## 2020-10-28 NOTE — Telephone Encounter (Signed)
Pt has lab appt on Monday but I don't see any future orders in Epic. Please place future orders if appropriate.

## 2020-10-31 ENCOUNTER — Other Ambulatory Visit (INDEPENDENT_AMBULATORY_CARE_PROVIDER_SITE_OTHER): Payer: Medicare Other

## 2020-10-31 ENCOUNTER — Other Ambulatory Visit: Payer: Self-pay

## 2020-10-31 DIAGNOSIS — E785 Hyperlipidemia, unspecified: Secondary | ICD-10-CM

## 2020-10-31 LAB — LIPID PANEL
Cholesterol: 218 mg/dL — ABNORMAL HIGH (ref 0–200)
HDL: 86.2 mg/dL (ref >39.00)
LDL Cholesterol: 118 mg/dL — ABNORMAL HIGH (ref 0–99)
NonHDL: 131.83
Total CHOL/HDL Ratio: 3
Triglycerides: 69 mg/dL (ref 0.0–149.0)
VLDL: 13.8 mg/dL (ref 0.0–40.0)

## 2020-10-31 LAB — COMPREHENSIVE METABOLIC PANEL
ALT: 21 U/L (ref 0–35)
AST: 19 U/L (ref 0–37)
Albumin: 4.4 g/dL (ref 3.5–5.2)
Alkaline Phosphatase: 49 U/L (ref 39–117)
BUN: 11 mg/dL (ref 6–23)
CO2: 31 mEq/L (ref 19–32)
Calcium: 9.5 mg/dL (ref 8.4–10.5)
Chloride: 100 mEq/L (ref 96–112)
Creatinine, Ser: 0.71 mg/dL (ref 0.40–1.20)
GFR: 88.41 mL/min (ref >60.00)
Glucose, Bld: 92 mg/dL (ref 70–99)
Potassium: 4.9 mEq/L (ref 3.5–5.1)
Sodium: 138 mEq/L (ref 135–145)
Total Bilirubin: 0.7 mg/dL (ref 0.2–1.2)
Total Protein: 6.8 g/dL (ref 6.0–8.3)

## 2020-11-07 ENCOUNTER — Other Ambulatory Visit: Payer: Self-pay

## 2020-11-07 ENCOUNTER — Ambulatory Visit
Admission: RE | Admit: 2020-11-07 | Discharge: 2020-11-07 | Disposition: A | Discharge status: Home / Self Care | Payer: Medicare Other | Source: Ambulatory Visit | Attending: Family Medicine | Admitting: Family Medicine

## 2020-11-07 DIAGNOSIS — E2839 Other primary ovarian failure: Secondary | ICD-10-CM

## 2020-11-10 ENCOUNTER — Encounter: Payer: Self-pay | Admitting: Family Medicine

## 2021-05-09 ENCOUNTER — Ambulatory Visit (INDEPENDENT_AMBULATORY_CARE_PROVIDER_SITE_OTHER): Payer: Medicare Other

## 2021-05-09 DIAGNOSIS — Z0000 Encounter for general adult medical examination without abnormal findings: Secondary | ICD-10-CM

## 2021-05-09 NOTE — Progress Notes (Signed)
Virtual Visit via Telephone Note ? ?I connected with  [name] on 05/09/21 at  1:30 PM EDT by telephone and verified that I am speaking with the correct person using two identifiers. ? ?Medicare Annual Wellness visit completed telephonically due to Covid-19 pandemic.  ? ?Persons participating in this call: This Health Coach and this patient.  ? ?Location: ?Patient: Home ?Provider: Office  ?  ?I discussed the limitations, risks, security and privacy concerns of performing an evaluation and management service by telephone and the availability of in person appointments. The patient expressed understanding and agreed to proceed. ? ?Unable to perform video visit due to video visit attempted and failed and/or patient does not have video capability.  ? ?Some vital signs may be absent or patient reported.  ? ?Willette Brace, LPN ? ? ?Subjective:  ? [name] is a 66 y.o. female who presents for Medicare Annual (Subsequent) preventive examination. ? ?Review of Systems    ? ?Cardiac Risk Factors include: advanced age (>95mn, >>62women);dyslipidemia ? ?   ?Objective:  ?  ?There were no vitals filed for this visit. ?There is no height or weight on file to calculate BMI. ? ? ?  05/09/2021  ?  1:41 PM 04/21/2020  ? 12:38 PM 04/28/2015  ?  7:18 AM 04/14/2015  ?  1:06 PM  ?Advanced Directives  ?Does Patient Have a Medical Advance Directive? No No No No  ?Would patient like information on creating a medical advance directive? Yes (MAU/Ambulatory/Procedural Areas - Information given) Yes (MAU/Ambulatory/Procedural Areas - Information given)    ? ? ?Current Medications (verified) ?Outpatient Encounter Medications as of 05/09/2021  ?Medication Sig  ? aspirin 81 MG tablet Take 81 mg by mouth daily.  ? Calcium Citrate-Vitamin D 200-250 MG-UNIT TABS   ? Lactobacillus-Inulin ([place] CAPS   ? MegaRed Omega-3 Krill Oil 500 MG CAPS   ? Multiple Vitamins-Minerals (MULTIVITAMIN WITH MINERALS) tablet Take 1 tablet by  mouth daily.  ? ?No facility-administered encounter medications on file as of 05/09/2021.  ? ? ?Allergies (verified) ?Duricef [cefadroxil monohydrate]  ? ?History: ?Past Medical History:  ?Diagnosis Date  ? Hyperlipidemia   ? Osteopenia   ? ?Past Surgical History:  ?Procedure Laterality Date  ? APPENDECTOMY  01/22/2010  ? COLONOSCOPY    ? ?Family History  ?Problem Relation Age of Onset  ? Heart disease Father   ? Stroke Mother   ? Colon cancer Neg Hx   ? Esophageal cancer Neg Hx   ? Rectal cancer Neg Hx   ? Stomach cancer Neg Hx   ? ?Social History  ? ?Socioeconomic History  ? Marital status: Married  ?  Spouse name: Not on file  ? Number of children: Not on file  ? Years of education: Not on file  ? Highest education level: Not on file  ?Occupational History  ? Occupation: retired  ?Tobacco Use  ? Smoking status: Never  ? Smokeless tobacco: Never  ?Vaping Use  ? Vaping Use: Never used  ?Substance and Sexual Activity  ? Alcohol use: Yes  ?  Alcohol/week: 3.0 standard drinks  ?  Types: 3 Glasses of wine per week  ?  Comment: wine daily  ? Drug use: No  ? Sexual activity: Yes  ?  Birth control/protection: Post-menopausal  ?Other Topics Concern  ? Not on file  ?Social History Narrative  ? Exercise 5 days a week  ? Live at home with husband  ? ?Social Determinants of Health  ? ?  Financial Resource Strain: Low Risk   ? Difficulty of Paying Living Expenses: Not hard at all  ?Food Insecurity: No Food Insecurity  ? Worried About Charity fundraiser in the Last Year: Never true  ? Ran Out of Food in the Last Year: Never true  ?Transportation Needs: No Transportation Needs  ? Lack of Transportation (Medical): No  ? Lack of Transportation (Non-Medical): No  ?Physical Activity: Sufficiently Active  ? Days of Exercise per Week: 6 days  ? Minutes of Exercise per Session: 60 min  ?Stress: No Stress Concern Present  ? Feeling of Stress : Not at all  ?Social Connections: Moderately Integrated  ? Frequency of Communication with Friends  and Family: More than three times a week  ? Frequency of Social Gatherings with Friends and Family: More than three times a week  ? Attends Religious Services: Never  ? Active Member of Clubs or Organizations: Yes  ? Attends Archivist Meetings: More than 4 times per year  ? Marital Status: Married  ? ? ?Tobacco Counseling ?Counseling given: Not Answered ? ? ?Clinical Intake: ? ?Pre-visit preparation completed: Yes ? ?Pain : No/denies pain ? ?  ? ?BMI - recorded: 17.44 ?Nutritional Status: BMI <19  Underweight ?Nutritional Risks: None ?Diabetes: No ? ?How often do you need to have someone help you when you read instructions, pamphlets, or other written materials from your doctor or pharmacy?: 1 - Never ? ?Diabetic?no ? ?Interpreter Needed?: No ? ?Information entered by :: Charlott Rakes, LPN ? ? ?Activities of Daily Living ? ?  05/09/2021  ?  1:43 PM  ?In your present state of health, do you have any difficulty performing the following activities:  ?Hearing? 0  ?Vision? 0  ?Difficulty concentrating or making decisions? 0  ?Walking or climbing stairs? 0  ?Dressing or bathing? 0  ?Doing errands, shopping? 0  ?Preparing Food and eating ? N  ?Using the Toilet? N  ?In the past six months, have you accidently leaked urine? N  ?Do you have problems with loss of bowel control? N  ?Managing your Medications? N  ?Managing your Finances? N  ?Housekeeping or managing your Housekeeping? N  ? ? ?Patient Care Team: ?Carollee Herter, Alferd Apa, DO as PCP - General (Family Medicine) ?Haverstock, Jennefer Bravo, MD as Referring Physician (Dermatology) ?Calvert Cantor, MD as Consulting Physician (Ophthalmology) ? ?Indicate any recent Medical Services you may have received from other than [facility] providers in the past year (date may be approximate). ? ?   ?Assessment:  ? This is a routine wellness examination for [name]. ? ?Hearing/Vision screen ?Hearing Screening - Comments:: Pt denies any hearing issues  ?Vision Screening - Comments:: Pt  follows up with dr Eulas Post @ [facility] for annual eye exams  ? ?Dietary issues and exercise activities discussed: ?Current Exercise Habits: Home exercise routine;Structured exercise class, Type of exercise: walking;yoga, Time (Minutes): > 60, Frequency (Times/Week): 6, Weekly Exercise (Minutes/Week): 0 ? ? Goals Addressed   ? ?  ?  ?  ?  ? This Visit's Progress  ?  Patient Stated     ?  Gain weight  ?  ? ?  ? ?Depression Screen ? ?  05/09/2021  ?  1:40 PM 04/21/2020  ? 12:41 PM 04/06/2019  ?  8:21 AM  ?PHQ 2/9 Scores  ?PHQ - 2 Score 0 0 0  ?  ?Fall Risk ? ?  05/09/2021  ?  1:43 PM 04/21/2020  ? 12:41 PM 04/06/2019  ?  8:49 AM  ?Fall Risk   ?Falls in the past year? 0 0 0  ?Number falls in past yr: 0 0 0  ?Injury with Fall? 0 0 0  ?Follow up Falls prevention discussed Falls prevention discussed   ? ? ?FALL RISK PREVENTION PERTAINING TO THE HOME: ? ?Any stairs in or around the home? Yes  ?If so, are there any without handrails? No  ?Home free of loose throw rugs in walkways, pet beds, electrical cords, etc? Yes  ?Adequate lighting in your home to reduce risk of falls? Yes  ? ?ASSISTIVE DEVICES UTILIZED TO PREVENT FALLS: ? ?Life alert? No  ?Use of a cane, walker or w/c? No  ?Grab bars in the bathroom? No  ?Shower chair or bench in shower? Yes  ?Elevated toilet seat or a handicapped toilet? No  ? ?TIMED UP AND GO: ? ?Was the test performed? No .  ? ?Cognitive Function: ? ?  04/06/2019  ?  8:52 AM  ?MMSE - Mini Mental State Exam  ?Orientation to time 5  ?Orientation to Place 5  ?Registration 3  ?Attention/ Calculation 5  ?Recall 3  ?Language- name 2 objects 2  ?Language- repeat 1  ?Language- follow 3 step command 3  ?Language- read & follow direction 1  ?Write a sentence 1  ?Copy design 1  ?Total score 30  ? ?  ? ?  05/09/2021  ?  1:44 PM  ?6CIT Screen  ?What Year? 0 points  ?What month? 0 points  ?What time? 0 points  ?Count back from 20 0 points  ?Months in reverse 0 points  ?Repeat phrase 0 points  ?Total Score 0 points   ? ? ?Immunizations ?Immunization History  ?Administered Date(s) Administered  ? Influenza Split 10/26/2019  ? Influenza,inj,Quad PF,6+ Mos 10/12/2015  ? Influenza-Unspecified 10/23/2014, 10/29/2016, 0

## 2021-05-09 NOTE — Patient Instructions (Addendum)
[name] , ?Thank you for taking time to come for your Medicare Wellness Visit. I appreciate your ongoing commitment to your health goals. Please review the following plan we discussed and let me know if I can assist you in the future.  ? ?Screening recommendations/referrals: ?Colonoscopy: Done 08/16/20 repeat every 10 years  ?Mammogram: Done 08/24/20 repeat every year  ?Bone Density: Done 11/07/20 repeat every 2 years  ?Recommended yearly ophthalmology/optometry visit for glaucoma screening and checkup ?Recommended yearly dental visit for hygiene and checkup ? ?Vaccinations: ?Influenza vaccine: Done 10/26/19 repeat every year  ?Pneumococcal vaccine: Up to date ?Tdap vaccine: Done 03/03/15 repeat every 10 years  ?Shingles vaccine: Completed  05/1516 & 10/08/16 ?Covid-19:Completed 2/11, 3/8, 11/05/19 & 4/27, 10/04/20 ? ?Advanced directives: Advance directive discussed with you today. I have provided a copy for you to complete at home and have notarized. Once this is complete please bring a copy in to our office so we can scan it into your chart. ? ?Conditions/risks identified: Gain weight  ? ?Next appointment: Follow up in one year for your annual wellness visit  ? ? ?Preventive Care 67 Years and Older, Female ?Preventive care refers to lifestyle choices and visits with your health care provider that can promote health and wellness. ?What does preventive care include? ?A yearly physical exam. This is also called an annual well check. ?Dental exams once or twice a year. ?Routine eye exams. Ask your health care provider how often you should have your eyes checked. ?Personal lifestyle choices, including: ?Daily care of your teeth and gums. ?Regular physical activity. ?Eating a healthy diet. ?Avoiding tobacco and drug use. ?Limiting alcohol use. ?Practicing safe sex. ?Taking low-dose aspirin every day. ?Taking vitamin and mineral supplements as recommended by your health care provider. ?What happens during an annual well  check? ?The services and screenings done by your health care provider during your annual well check will depend on your age, overall health, lifestyle risk factors, and family history of disease. ?Counseling  ?Your health care provider may ask you questions about your: ?Alcohol use. ?Tobacco use. ?Drug use. ?Emotional well-being. ?Home and relationship well-being. ?Sexual activity. ?Eating habits. ?History of falls. ?Memory and ability to understand (cognition). ?Work and work Statistician. ?Reproductive health. ?Screening  ?You may have the following tests or measurements: ?Height, weight, and BMI. ?Blood pressure. ?Lipid and cholesterol levels. These may be checked every 5 years, or more frequently if you are over 52 years old. ?Skin check. ?Lung cancer screening. You may have this screening every year starting at age 56 if you have a 30-pack-year history of smoking and currently smoke or have quit within the past 15 years. ?Fecal occult blood test (FOBT) of the stool. You may have this test every year starting at age 35. ?Flexible sigmoidoscopy or colonoscopy. You may have a sigmoidoscopy every 5 years or a colonoscopy every 10 years starting at age 74. ?Hepatitis C blood test. ?Hepatitis B blood test. ?Sexually transmitted disease (STD) testing. ?Diabetes screening. This is done by checking your blood sugar (glucose) after you have not eaten for a while (fasting). You may have this done every 1-3 years. ?Bone density scan. This is done to screen for osteoporosis. You may have this done starting at age 58. ?Mammogram. This may be done every 1-2 years. Talk to your health care provider about how often you should have regular mammograms. ?Talk with your health care provider about your test results, treatment options, and if necessary, the need for more tests. ?Vaccines  ?  Your health care provider may recommend certain vaccines, such as: ?Influenza vaccine. This is recommended every year. ?Tetanus, diphtheria, and  acellular pertussis (Tdap, Td) vaccine. You may need a Td booster every 10 years. ?Zoster vaccine. You may need this after age 35. ?Pneumococcal 13-valent conjugate (PCV13) vaccine. One dose is recommended after age 36. ?Pneumococcal polysaccharide (PPSV23) vaccine. One dose is recommended after age 5. ?Talk to your health care provider about which screenings and vaccines you need and how often you need them. ?This information is not intended to replace advice given to you by your health care provider. Make sure you discuss any questions you have with your health care provider. ?Document Released: 02/04/2015 Document Revised: 09/28/2015 Document Reviewed: 11/09/2014 ?Elsevier Interactive Patient Education ? 2017 [place]. ? ?Fall Prevention in the Home ?Falls can cause injuries. They can happen to people of all ages. There are many things you can do to make your home safe and to help prevent falls. ?What can I do on the outside of my home? ?Regularly fix the edges of walkways and driveways and fix any cracks. ?Remove anything that might make you trip as you walk through a door, such as a raised step or threshold. ?Trim any bushes or trees on the path to your home. ?Use bright outdoor lighting. ?Clear any walking paths of anything that might make someone trip, such as rocks or tools. ?Regularly check to see if handrails are loose or broken. Make sure that both sides of any steps have handrails. ?Any raised decks and porches should have guardrails on the edges. ?Have any leaves, snow, or ice cleared regularly. ?Use sand or salt on walking paths during winter. ?Clean up any spills in your garage right away. This includes oil or grease spills. ?What can I do in the bathroom? ?Use night lights. ?Install grab bars by the toilet and in the tub and shower. Do not use towel bars as grab bars. ?Use non-skid mats or decals in the tub or shower. ?If you need to sit down in the shower, use a plastic, non-slip stool. ?Keep  the floor dry. Clean up any water that spills on the floor as soon as it happens. ?Remove soap buildup in the tub or shower regularly. ?Attach bath mats securely with double-sided non-slip rug tape. ?Do not have throw rugs and other things on the floor that can make you trip. ?What can I do in the bedroom? ?Use night lights. ?Make sure that you have a light by your bed that is easy to reach. ?Do not use any sheets or blankets that are too big for your bed. They should not hang down onto the floor. ?Have a firm chair that has side arms. You can use this for support while you get dressed. ?Do not have throw rugs and other things on the floor that can make you trip. ?What can I do in the kitchen? ?Clean up any spills right away. ?Avoid walking on wet floors. ?Keep items that you use a lot in easy-to-reach places. ?If you need to reach something above you, use a strong step stool that has a grab bar. ?Keep electrical cords out of the way. ?Do not use floor polish or wax that makes floors slippery. If you must use wax, use non-skid floor wax. ?Do not have throw rugs and other things on the floor that can make you trip. ?What can I do with my stairs? ?Do not leave any items on the stairs. ?Make sure that there are  handrails on both sides of the stairs and use them. Fix handrails that are broken or loose. Make sure that handrails are as long as the stairways. ?Check any carpeting to make sure that it is firmly attached to the stairs. Fix any carpet that is loose or worn. ?Avoid having throw rugs at the top or bottom of the stairs. If you do have throw rugs, attach them to the floor with carpet tape. ?Make sure that you have a light switch at the top of the stairs and the bottom of the stairs. If you do not have them, ask someone to add them for you. ?What else can I do to help prevent falls? ?Wear shoes that: ?Do not have high heels. ?Have rubber bottoms. ?Are comfortable and fit you well. ?Are closed at the toe. Do not  wear sandals. ?If you use a stepladder: ?Make sure that it is fully opened. Do not climb a closed stepladder. ?Make sure that both sides of the stepladder are locked into place. ?Ask someone to hold it for you, i

## 2021-07-19 ENCOUNTER — Other Ambulatory Visit: Payer: Self-pay | Admitting: Family Medicine

## 2021-07-19 DIAGNOSIS — Z1231 Encounter for screening mammogram for malignant neoplasm of breast: Secondary | ICD-10-CM

## 2021-08-10 ENCOUNTER — Ambulatory Visit: Payer: Medicare Other | Admitting: Family Medicine

## 2021-08-15 ENCOUNTER — Encounter: Payer: Self-pay | Admitting: Family Medicine

## 2021-08-15 ENCOUNTER — Ambulatory Visit (INDEPENDENT_AMBULATORY_CARE_PROVIDER_SITE_OTHER): Payer: Medicare Other | Admitting: Family Medicine

## 2021-08-15 VITALS — BP 110/70 | HR 64 | Temp 97.6°F | Resp 16 | Ht 63.0 in | Wt 95.4 lb

## 2021-08-15 DIAGNOSIS — E785 Hyperlipidemia, unspecified: Secondary | ICD-10-CM

## 2021-08-15 NOTE — Patient Instructions (Signed)
Cholesterol Content in Foods ?Cholesterol is a waxy, fat-like substance that helps to carry fat in the blood. The body needs cholesterol in small amounts, but too much cholesterol can cause damage to the arteries and heart. ?What foods have cholesterol? ? ?Cholesterol is found in animal-based foods, such as meat, seafood, and dairy. Generally, low-fat dairy and lean meats have less cholesterol than full-fat dairy and fatty meats. The milligrams of cholesterol per serving (mg per serving) of common cholesterol-containing foods are listed below. ?Meats and other proteins ?Egg -- one large whole egg has 186 mg. ?Veal shank -- 4 oz (113 g) has 141 mg. ?Lean ground turkey (93% lean) -- 4 oz (113 g) has 118 mg. ?Fat-trimmed lamb loin -- 4 oz (113 g) has 106 mg. ?Lean ground beef (90% lean) -- 4 oz (113 g) has 100 mg. ?Lobster -- 3.5 oz (99 g) has 90 mg. ?Pork loin chops -- 4 oz (113 g) has 86 mg. ?Canned salmon -- 3.5 oz (99 g) has 83 mg. ?Fat-trimmed beef top loin -- 4 oz (113 g) has 78 mg. ?Frankfurter -- 1 frank (3.5 oz or 99 g) has 77 mg. ?Crab -- 3.5 oz (99 g) has 71 mg. ?Roasted chicken without skin, white meat -- 4 oz (113 g) has 66 mg. ?Light bologna -- 2 oz (57 g) has 45 mg. ?Deli-cut turkey -- 2 oz (57 g) has 31 mg. ?Canned tuna -- 3.5 oz (99 g) has 31 mg. ?Bacon -- 1 oz (28 g) has 29 mg. ?Oysters and mussels (raw) -- 3.5 oz (99 g) has 25 mg. ?Mackerel -- 1 oz (28 g) has 22 mg. ?Trout -- 1 oz (28 g) has 20 mg. ?Pork sausage -- 1 link (1 oz or 28 g) has 17 mg. ?Salmon -- 1 oz (28 g) has 16 mg. ?Tilapia -- 1 oz (28 g) has 14 mg. ?Dairy ?Soft-serve ice cream -- ? cup (4 oz or 86 g) has 103 mg. ?Whole-milk yogurt -- 1 cup (8 oz or 245 g) has 29 mg. ?Cheddar cheese -- 1 oz (28 g) has 28 mg. ?American cheese -- 1 oz (28 g) has 28 mg. ?Whole milk -- 1 cup (8 oz or 250 mL) has 23 mg. ?2% milk -- 1 cup (8 oz or 250 mL) has 18 mg. ?Cream cheese -- 1 tablespoon (Tbsp) (14.5 g) has 15 mg. ?Cottage cheese -- ? cup (4 oz or  113 g) has 14 mg. ?Low-fat (1%) milk -- 1 cup (8 oz or 250 mL) has 10 mg. ?Sour cream -- 1 Tbsp (12 g) has 8.5 mg. ?Low-fat yogurt -- 1 cup (8 oz or 245 g) has 8 mg. ?Nonfat Greek yogurt -- 1 cup (8 oz or 228 g) has 7 mg. ?Half-and-half cream -- 1 Tbsp (15 mL) has 5 mg. ?Fats and oils ?Cod liver oil -- 1 tablespoon (Tbsp) (13.6 g) has 82 mg. ?Butter -- 1 Tbsp (14 g) has 15 mg. ?Lard -- 1 Tbsp (12.8 g) has 14 mg. ?Bacon grease -- 1 Tbsp (12.9 g) has 14 mg. ?Mayonnaise -- 1 Tbsp (13.8 g) has 5-10 mg. ?Margarine -- 1 Tbsp (14 g) has 3-10 mg. ?The items listed above may not be a complete list of foods with cholesterol. Exact amounts of cholesterol in these foods may vary depending on specific ingredients and brands. Contact a dietitian for more information. ?What foods do not have cholesterol? ?Most plant-based foods do not have cholesterol unless you combine them with a food that has   cholesterol. Foods without cholesterol include: ?Grains and cereals. ?Vegetables. ?Fruits. ?Vegetable oils, such as olive, canola, and sunflower oil. ?Legumes, such as peas, beans, and lentils. ?Nuts and seeds. ?Egg whites. ?The items listed above may not be a complete list of foods that do not have cholesterol. Contact a dietitian for more information. ?Summary ?The body needs cholesterol in small amounts, but too much cholesterol can cause damage to the arteries and heart. ?Cholesterol is found in animal-based foods, such as meat, seafood, and dairy. Generally, low-fat dairy and lean meats have less cholesterol than full-fat dairy and fatty meats. ?This information is not intended to replace advice given to you by your health care provider. Make sure you discuss any questions you have with your health care provider. ?Document Revised: 05/20/2020 Document Reviewed: 05/20/2020 ?Elsevier Patient Education ? 2023 [employer] ? ?

## 2021-08-15 NOTE — Progress Notes (Signed)
Subjective:   By signing my name below, I, [name], attest that this documentation has been prepared under the direction and in the presence of Ann Held, DO  08/15/2021    Patient ID: [name], female    DOB: 1955/07/05, 66 y.o.   MRN: [id number]  No chief complaint on file.   HPI Patient is in today for a follow up visit.   She exercises regularly, but adds that she has gained weight. She has been drinking the chocolate Prolife Protein drinks regularly. She avoids eating beef. No changes in family history or surgical procedures/operations. She does not need any refills at this time. She has taken the pneumonia and pfizer vaccines.    Past Medical History:  Diagnosis Date   Hyperlipidemia    Osteopenia     Past Surgical History:  Procedure Laterality Date   APPENDECTOMY  01/22/2010   COLONOSCOPY      Family History  Problem Relation Age of Onset   Heart disease Father    Stroke Mother    Colon cancer Neg Hx    Esophageal cancer Neg Hx    Rectal cancer Neg Hx    Stomach cancer Neg Hx     Social History   Socioeconomic History   Marital status: Married    Spouse name: Not on file   Number of children: Not on file   Years of education: Not on file   Highest education level: Not on file  Occupational History   Occupation: retired  Tobacco Use   Smoking status: Never   Smokeless tobacco: Never  Vaping Use   Vaping Use: Never used  Substance and Sexual Activity   Alcohol use: Yes    Alcohol/week: 3.0 standard drinks of alcohol    Types: 3 Glasses of wine per week    Comment: wine daily   Drug use: No   Sexual activity: Yes    Birth control/protection: Post-menopausal  Other Topics Concern   Not on file  Social History Narrative   Exercise 5 days a week   Live at home with husband   Social Determinants of Health   Financial Resource Strain: Low Risk  (05/09/2021)   Overall Financial Resource Strain (CARDIA)    Difficulty of  Paying Living Expenses: Not hard at all  Food Insecurity: No Food Insecurity (05/09/2021)   Hunger Vital Sign    Worried About Running Out of Food in the Last Year: Never true    Ran Out of Food in the Last Year: Never true  Transportation Needs: No Transportation Needs (05/09/2021)   PRAPARE - Hydrologist (Medical): No    Lack of Transportation (Non-Medical): No  Physical Activity: Sufficiently Active (05/09/2021)   Exercise Vital Sign    Days of Exercise per Week: 6 days    Minutes of Exercise per Session: 60 min  Stress: No Stress Concern Present (05/09/2021)   [place]    Feeling of Stress : Not at all  Social Connections: Moderately Integrated (05/09/2021)   Social Connection and Isolation Panel [NHANES]    Frequency of Communication with Friends and Family: More than three times a week    Frequency of Social Gatherings with Friends and Family: More than three times a week    Attends Religious Services: Never    Marine scientist or Organizations: Yes    Attends Music therapist: More than 4  times per year    Marital Status: Married  Human resources officer Violence: Not At Risk (05/09/2021)   Humiliation, Afraid, Rape, and Kick questionnaire    Fear of Current or Ex-Partner: No    Emotionally Abused: No    Physically Abused: No    Sexually Abused: No    Outpatient Medications Prior to Visit  Medication Sig Dispense Refill   aspirin 81 MG tablet Take 81 mg by mouth daily.     Calcium Citrate-Vitamin D 200-250 MG-UNIT TABS      Lactobacillus-Inulin (CULTURELLE DIGESTIVE HEALTH) CAPS      MegaRed Omega-3 Krill Oil 500 MG CAPS      Multiple Vitamins-Minerals (MULTIVITAMIN WITH MINERALS) tablet Take 1 tablet by mouth daily.     No facility-administered medications prior to visit.    Allergies  Allergen Reactions   Duricef [Cefadroxil Monohydrate] Rash    ROS      Objective:    Physical Exam Constitutional:      Appearance: Normal appearance. She is not ill-appearing.  HENT:     Head: Normocephalic and atraumatic.     Right Ear: External ear normal.     Left Ear: External ear normal.  Eyes:     Extraocular Movements: Extraocular movements intact.     Pupils: Pupils are equal, round, and reactive to light.  Cardiovascular:     Rate and Rhythm: Normal rate and regular rhythm.     Pulses: Normal pulses.     Heart sounds: Normal heart sounds. No murmur heard.    No gallop.  Pulmonary:     Effort: Pulmonary effort is normal. No respiratory distress.     Breath sounds: Normal breath sounds. No wheezing or rales.  Skin:    General: Skin is warm and dry.  Neurological:     Mental Status: She is alert and oriented to person, place, and time.  Psychiatric:        Judgment: Judgment normal.     There were no vitals taken for this visit. Wt Readings from Last 3 Encounters:  08/16/20 98 lb 6.4 oz (44.6 kg)  08/09/20 99 lb (44.9 kg)  08/02/20 98 lb 6.4 oz (44.6 kg)    Diabetic Foot Exam - Simple   No data filed    Lab Results  Component Value Date   WBC 4.7 04/06/2019   HGB 14.6 04/06/2019   HCT 43.4 04/06/2019   PLT 212.0 04/06/2019   GLUCOSE 92 10/31/2020   CHOL 218 (H) 10/31/2020   TRIG 69.0 10/31/2020   HDL 86.20 10/31/2020   LDLCALC 118 (H) 10/31/2020   ALT 21 10/31/2020   AST 19 10/31/2020   NA 138 10/31/2020   K 4.9 10/31/2020   CL 100 10/31/2020   CREATININE 0.71 10/31/2020   BUN 11 10/31/2020   CO2 31 10/31/2020   TSH 2.67 03/31/2018    Lab Results  Component Value Date   TSH 2.67 03/31/2018   Lab Results  Component Value Date   WBC 4.7 04/06/2019   HGB 14.6 04/06/2019   HCT 43.4 04/06/2019   MCV 99.6 04/06/2019   PLT 212.0 04/06/2019   Lab Results  Component Value Date   NA 138 10/31/2020   K 4.9 10/31/2020   CO2 31 10/31/2020   GLUCOSE 92 10/31/2020   BUN 11 10/31/2020   CREATININE 0.71 10/31/2020    BILITOT 0.7 10/31/2020   ALKPHOS 49 10/31/2020   AST 19 10/31/2020   ALT 21 10/31/2020   PROT 6.8 10/31/2020  ALBUMIN 4.4 10/31/2020   CALCIUM 9.5 10/31/2020   GFR 88.41 10/31/2020   Lab Results  Component Value Date   CHOL 218 (H) 10/31/2020   Lab Results  Component Value Date   HDL 86.20 10/31/2020   Lab Results  Component Value Date   LDLCALC 118 (H) 10/31/2020   Lab Results  Component Value Date   TRIG 69.0 10/31/2020   Lab Results  Component Value Date   CHOLHDL 3 10/31/2020   No results found for: "HGBA1C"     Assessment & Plan:   Problem List Items Addressed This Visit   None    No orders of the defined types were placed in this encounter.   I, Shehryar [place], personally preformed the services described in this documentation.  All medical record entries made by the scribe were at my direction and in my presence.  I have reviewed the chart and discharge instructions (if applicable) and agree that the record reflects my personal performance and is accurate and complete. 08/15/2021   I,[name],acting as a scribe for Ann Held, DO.,have documented all relevant documentation on the behalf of Ann Held, DO,as directed by  Ann Held, DO while in the presence of Ann Held, DO.   Shehryar [employer]

## 2021-08-16 LAB — CBC WITH DIFFERENTIAL/PLATELET
Basophils Absolute: 0 10*3/uL (ref 0.0–0.1)
Basophils Relative: 1.1 % (ref 0.0–3.0)
Eosinophils Absolute: 0.1 10*3/uL (ref 0.0–0.7)
Eosinophils Relative: 2 % (ref 0.0–5.0)
HCT: 44.3 % (ref 36.0–46.0)
Hemoglobin: 14.8 g/dL (ref 12.0–15.0)
Lymphocytes Relative: 32.9 % (ref 12.0–46.0)
Lymphs Abs: 1.2 10*3/uL (ref 0.7–4.0)
MCHC: 33.4 g/dL (ref 30.0–36.0)
MCV: 100.2 fl — ABNORMAL HIGH (ref 78.0–100.0)
Monocytes Absolute: 0.4 10*3/uL (ref 0.1–1.0)
Monocytes Relative: 9.9 % (ref 3.0–12.0)
Neutro Abs: 2 10*3/uL (ref 1.4–7.7)
Neutrophils Relative %: 54.1 % (ref 43.0–77.0)
Platelets: 199 10*3/uL (ref 150.0–400.0)
RBC: 4.42 Mil/uL (ref 3.87–5.11)
RDW: 13.2 % (ref 11.5–15.5)
WBC: 3.6 10*3/uL — ABNORMAL LOW (ref 4.0–10.5)

## 2021-08-16 LAB — LIPID PANEL
Cholesterol: 236 mg/dL — ABNORMAL HIGH (ref 0–200)
HDL: 96.6 mg/dL (ref >39.00)
LDL Cholesterol: 128 mg/dL — ABNORMAL HIGH (ref 0–99)
NonHDL: 139.26
Total CHOL/HDL Ratio: 2
Triglycerides: 57 mg/dL (ref 0.0–149.0)
VLDL: 11.4 mg/dL (ref 0.0–40.0)

## 2021-08-16 LAB — COMPREHENSIVE METABOLIC PANEL
ALT: 28 U/L (ref 0–35)
AST: 26 U/L (ref 0–37)
Albumin: 4.8 g/dL (ref 3.5–5.2)
Alkaline Phosphatase: 46 U/L (ref 39–117)
BUN: 10 mg/dL (ref 6–23)
CO2: 32 mEq/L (ref 19–32)
Calcium: 10.1 mg/dL (ref 8.4–10.5)
Chloride: 101 mEq/L (ref 96–112)
Creatinine, Ser: 0.74 mg/dL (ref 0.40–1.20)
GFR: 83.66 mL/min (ref >60.00)
Glucose, Bld: 91 mg/dL (ref 70–99)
Potassium: 5.1 mEq/L (ref 3.5–5.1)
Sodium: 141 mEq/L (ref 135–145)
Total Bilirubin: 0.7 mg/dL (ref 0.2–1.2)
Total Protein: 7.3 g/dL (ref 6.0–8.3)

## 2021-08-16 NOTE — Assessment & Plan Note (Signed)
Encourage heart healthy diet such as MIND or DASH diet, increase exercise, avoid trans fats, simple carbohydrates and processed foods, consider a krill or fish or flaxseed oil cap daily.  °

## 2021-09-07 ENCOUNTER — Ambulatory Visit: Payer: Medicare Other

## 2021-09-19 ENCOUNTER — Ambulatory Visit: Payer: Medicare Other

## 2021-10-11 ENCOUNTER — Ambulatory Visit
Admission: RE | Admit: 2021-10-11 | Discharge: 2021-10-11 | Disposition: A | Discharge status: Home / Self Care | Payer: Medicare Other | Source: Ambulatory Visit | Attending: Family Medicine | Admitting: Family Medicine

## 2021-10-11 ENCOUNTER — Ambulatory Visit: Payer: Medicare Other

## 2021-10-11 DIAGNOSIS — Z1231 Encounter for screening mammogram for malignant neoplasm of breast: Secondary | ICD-10-CM

## 2022-01-31 ENCOUNTER — Telehealth: Payer: Self-pay | Admitting: Family Medicine

## 2022-01-31 NOTE — Telephone Encounter (Signed)
Pt called to RS appt and realized that she had been scheduled with Dr. Etter Sjogren for a f/u appt. Pt stated that she was under the impression that she was only coming to recheck labs. After reviewing chart, did verify that Dr. Etter Sjogren wanted to recheck her labs in 6 months off her previous set of labs but there were no active orders. Pt asked if a note could be sent back to verify what she is needing to come back for. Please Advise.

## 2022-02-01 ENCOUNTER — Other Ambulatory Visit: Payer: Self-pay | Admitting: Family Medicine

## 2022-02-01 DIAGNOSIS — E785 Hyperlipidemia, unspecified: Secondary | ICD-10-CM

## 2022-02-16 ENCOUNTER — Ambulatory Visit: Payer: Medicare Other | Admitting: Family Medicine

## 2022-02-16 ENCOUNTER — Other Ambulatory Visit (INDEPENDENT_AMBULATORY_CARE_PROVIDER_SITE_OTHER): Payer: Medicare Other

## 2022-02-16 DIAGNOSIS — E785 Hyperlipidemia, unspecified: Secondary | ICD-10-CM | POA: Diagnosis not present

## 2022-02-16 LAB — COMPREHENSIVE METABOLIC PANEL
ALT: 24 U/L (ref 0–35)
AST: 21 U/L (ref 0–37)
Albumin: 4.5 g/dL (ref 3.5–5.2)
Alkaline Phosphatase: 45 U/L (ref 39–117)
BUN: 14 mg/dL (ref 6–23)
CO2: 32 mEq/L (ref 19–32)
Calcium: 9.4 mg/dL (ref 8.4–10.5)
Chloride: 98 mEq/L (ref 96–112)
Creatinine, Ser: 0.65 mg/dL (ref 0.40–1.20)
GFR: 90.72 mL/min (ref >60.00)
Glucose, Bld: 99 mg/dL (ref 70–99)
Potassium: 4.7 mEq/L (ref 3.5–5.1)
Sodium: 137 mEq/L (ref 135–145)
Total Bilirubin: 0.7 mg/dL (ref 0.2–1.2)
Total Protein: 6.9 g/dL (ref 6.0–8.3)

## 2022-02-16 LAB — LIPID PANEL
Cholesterol: 225 mg/dL — ABNORMAL HIGH (ref 0–200)
HDL: 94.1 mg/dL (ref >39.00)
LDL Cholesterol: 118 mg/dL — ABNORMAL HIGH (ref 0–99)
NonHDL: 130.96
Total CHOL/HDL Ratio: 2
Triglycerides: 67 mg/dL (ref 0.0–149.0)
VLDL: 13.4 mg/dL (ref 0.0–40.0)

## 2022-02-16 LAB — CBC WITH DIFFERENTIAL/PLATELET
Basophils Absolute: 0 10*3/uL (ref 0.0–0.1)
Basophils Relative: 1 % (ref 0.0–3.0)
Eosinophils Absolute: 0.1 10*3/uL (ref 0.0–0.7)
Eosinophils Relative: 3 % (ref 0.0–5.0)
HCT: 43.8 % (ref 36.0–46.0)
Hemoglobin: 14.9 g/dL (ref 12.0–15.0)
Lymphocytes Relative: 35.2 % (ref 12.0–46.0)
Lymphs Abs: 1.1 10*3/uL (ref 0.7–4.0)
MCHC: 34.1 g/dL (ref 30.0–36.0)
MCV: 99.7 fl (ref 78.0–100.0)
Monocytes Absolute: 0.3 10*3/uL (ref 0.1–1.0)
Monocytes Relative: 11 % (ref 3.0–12.0)
Neutro Abs: 1.5 10*3/uL (ref 1.4–7.7)
Neutrophils Relative %: 49.8 % (ref 43.0–77.0)
Platelets: 209 10*3/uL (ref 150.0–400.0)
RBC: 4.39 Mil/uL (ref 3.87–5.11)
RDW: 12.8 % (ref 11.5–15.5)
WBC: 3 10*3/uL — ABNORMAL LOW (ref 4.0–10.5)

## 2022-05-02 ENCOUNTER — Telehealth: Payer: Self-pay | Admitting: Family Medicine

## 2022-05-02 NOTE — Telephone Encounter (Signed)
Contacted [name] to schedule their annual wellness visit. Appointment made for 05/11/2022.  Verlee Rossetti; Care Guide Ambulatory Clinical Support [facility] Direct Dial: [phone]

## 2022-05-02 NOTE — Telephone Encounter (Signed)
Copied from CRM [id number]. Topic: Medicare AWV >> May 02, 2022 12:54 PM Payton Doughty wrote: Reason for CRM: Called patient to schedule Medicare Annual Wellness Visit (AWV). Left message for patient to call back and schedule Medicare Annual Wellness Visit (AWV).  Last date of AWV: 05/09/21  Please schedule an appointment at any time with Kandis Cocking, LPN .  If any questions, please contact me.  Thank you ,  Verlee Rossetti; Care Guide Ambulatory Clinical Support [facility] Direct Dial: [phone]

## 2022-05-11 ENCOUNTER — Telehealth: Payer: Self-pay | Admitting: Family Medicine

## 2022-05-11 ENCOUNTER — Ambulatory Visit (INDEPENDENT_AMBULATORY_CARE_PROVIDER_SITE_OTHER): Payer: Medicare Other

## 2022-05-11 ENCOUNTER — Ambulatory Visit: Payer: Medicare Other

## 2022-05-11 VITALS — Ht 63.0 in | Wt 94.0 lb

## 2022-05-11 DIAGNOSIS — Z0000 Encounter for general adult medical examination without abnormal findings: Secondary | ICD-10-CM | POA: Diagnosis not present

## 2022-05-11 NOTE — Patient Instructions (Signed)
[name] , Thank you for taking time to come for your Medicare Wellness Visit. I appreciate your ongoing commitment to your health goals. Please review the following plan we discussed and let me know if I can assist you in the future.   These are the goals we discussed:  Goals      Patient Stated     Would like to gain some weight     Patient Stated     Gain weight, planing for retirement years        This is a list of the screening recommended for you and due dates:  Health Maintenance  Topic Date Due   Pneumonia Vaccine (2 of 2 - PCV) 05/05/2021   COVID-19 Vaccine (6 - 2023-24 season) 09/22/2021   Flu Shot  08/23/2022   Mammogram  10/12/2022   Medicare Annual Wellness Visit  05/11/2023   DTaP/Tdap/Td vaccine (2 - Td or Tdap) 03/02/2025   Colon Cancer Screening  08/17/2030   DEXA scan (bone density measurement)  Completed   Hepatitis C Screening: USPSTF Recommendation to screen - Ages 70-79 yo.  Completed   Zoster (Shingles) Vaccine  Completed   HPV Vaccine  Aged Out    Advanced directives:  Discussed patient to pick up copy from office  Conditions/risks identified: Keep up the good work  Next appointment: Follow up in one year for your annual wellness visit   05/14/23   Preventive Care 65 Years and Older, Female Preventive care refers to lifestyle choices and visits with your health care provider that can promote health and wellness. What does preventive care include? A yearly physical exam. This is also called an annual well check. Dental exams once or twice a year. Routine eye exams. Ask your health care provider how often you should have your eyes checked. Personal lifestyle choices, including: Daily care of your teeth and gums. Regular physical activity. Eating a healthy diet. Avoiding tobacco and drug use. Limiting alcohol use. Practicing safe sex. Taking low-dose aspirin every day. Taking vitamin and mineral supplements as recommended by your health care  provider. What happens during an annual well check? The services and screenings done by your health care provider during your annual well check will depend on your age, overall health, lifestyle risk factors, and family history of disease. Counseling  Your health care provider may ask you questions about your: Alcohol use. Tobacco use. Drug use. Emotional well-being. Home and relationship well-being. Sexual activity. Eating habits. History of falls. Memory and ability to understand (cognition). Work and work Astronomer. Reproductive health. Screening  You may have the following tests or measurements: Height, weight, and BMI. Blood pressure. Lipid and cholesterol levels. These may be checked every 5 years, or more frequently if you are over 56 years old. Skin check. Lung cancer screening. You may have this screening every year starting at age 77 if you have a 30-pack-year history of smoking and currently smoke or have quit within the past 15 years. Fecal occult blood test (FOBT) of the stool. You may have this test every year starting at age 75. Flexible sigmoidoscopy or colonoscopy. You may have a sigmoidoscopy every 5 years or a colonoscopy every 10 years starting at age 37. Hepatitis C blood test. Hepatitis B blood test. Sexually transmitted disease (STD) testing. Diabetes screening. This is done by checking your blood sugar (glucose) after you have not eaten for a while (fasting). You may have this done every 1-3 years. Bone density scan. This is done to  screen for osteoporosis. You may have this done starting at age 42. Mammogram. This may be done every 1-2 years. Talk to your health care provider about how often you should have regular mammograms. Talk with your health care provider about your test results, treatment options, and if necessary, the need for more tests. Vaccines  Your health care provider may recommend certain vaccines, such as: Influenza vaccine. This is  recommended every year. Tetanus, diphtheria, and acellular pertussis (Tdap, Td) vaccine. You may need a Td booster every 10 years. Zoster vaccine. You may need this after age 82. Pneumococcal 13-valent conjugate (PCV13) vaccine. One dose is recommended after age 20. Pneumococcal polysaccharide (PPSV23) vaccine. One dose is recommended after age 13. Talk to your health care provider about which screenings and vaccines you need and how often you need them. This information is not intended to replace advice given to you by your health care provider. Make sure you discuss any questions you have with your health care provider. Document Released: 02/04/2015 Document Revised: 09/28/2015 Document Reviewed: 11/09/2014 Elsevier Interactive Patient Education  2017 [place] Prevention in the Home Falls can cause injuries. They can happen to people of all ages. There are many things you can do to make your home safe and to help prevent falls. What can I do on the outside of my home? Regularly fix the edges of walkways and driveways and fix any cracks. Remove anything that might make you trip as you walk through a door, such as a raised step or threshold. Trim any bushes or trees on the path to your home. Use bright outdoor lighting. Clear any walking paths of anything that might make someone trip, such as rocks or tools. Regularly check to see if handrails are loose or broken. Make sure that both sides of any steps have handrails. Any raised decks and porches should have guardrails on the edges. Have any leaves, snow, or ice cleared regularly. Use sand or salt on walking paths during winter. Clean up any spills in your garage right away. This includes oil or grease spills. What can I do in the bathroom? Use night lights. Install grab bars by the toilet and in the tub and shower. Do not use towel bars as grab bars. Use non-skid mats or decals in the tub or shower. If you need to sit down in  the shower, use a plastic, non-slip stool. Keep the floor dry. Clean up any water that spills on the floor as soon as it happens. Remove soap buildup in the tub or shower regularly. Attach bath mats securely with double-sided non-slip rug tape. Do not have throw rugs and other things on the floor that can make you trip. What can I do in the bedroom? Use night lights. Make sure that you have a light by your bed that is easy to reach. Do not use any sheets or blankets that are too big for your bed. They should not hang down onto the floor. Have a firm chair that has side arms. You can use this for support while you get dressed. Do not have throw rugs and other things on the floor that can make you trip. What can I do in the kitchen? Clean up any spills right away. Avoid walking on wet floors. Keep items that you use a lot in easy-to-reach places. If you need to reach something above you, use a strong step stool that has a grab bar. Keep electrical cords out of the way.  Do not use floor polish or wax that makes floors slippery. If you must use wax, use non-skid floor wax. Do not have throw rugs and other things on the floor that can make you trip. What can I do with my stairs? Do not leave any items on the stairs. Make sure that there are handrails on both sides of the stairs and use them. Fix handrails that are broken or loose. Make sure that handrails are as long as the stairways. Check any carpeting to make sure that it is firmly attached to the stairs. Fix any carpet that is loose or worn. Avoid having throw rugs at the top or bottom of the stairs. If you do have throw rugs, attach them to the floor with carpet tape. Make sure that you have a light switch at the top of the stairs and the bottom of the stairs. If you do not have them, ask someone to add them for you. What else can I do to help prevent falls? Wear shoes that: Do not have high heels. Have rubber bottoms. Are comfortable  and fit you well. Are closed at the toe. Do not wear sandals. If you use a stepladder: Make sure that it is fully opened. Do not climb a closed stepladder. Make sure that both sides of the stepladder are locked into place. Ask someone to hold it for you, if possible. Clearly mark and make sure that you can see: Any grab bars or handrails. First and last steps. Where the edge of each step is. Use tools that help you move around (mobility aids) if they are needed. These include: Canes. Walkers. Scooters. Crutches. Turn on the lights when you go into a dark area. Replace any light bulbs as soon as they burn out. Set up your furniture so you have a clear path. Avoid moving your furniture around. If any of your floors are uneven, fix them. If there are any pets around you, be aware of where they are. Review your medicines with your doctor. Some medicines can make you feel dizzy. This can increase your chance of falling. Ask your doctor what other things that you can do to help prevent falls. This information is not intended to replace advice given to you by your health care provider. Make sure you discuss any questions you have with your health care provider. Document Released: 11/04/2008 Document Revised: 06/16/2015 Document Reviewed: 02/12/2014 Elsevier Interactive Patient Education  2017 [employer].

## 2022-05-11 NOTE — Telephone Encounter (Signed)
Copied from CRM [id number]. Topic: Medicare AWV >> May 11, 2022  9:16 AM Payton Doughty wrote: Reason for CRM: Called patient to reschedule Medicare Annual Wellness Visit (AWV). Left message for patient to call back and reschedule Medicare Annual Wellness Visit (AWV).LVM 05/11/22 :44am to r/s appt kh[facility]  Last date of AWV:   Please schedule an appointment at any time with Donne Anon, CMA  .  If any questions, please contact me.  Thank you ,  Verlee Rossetti; Care Guide Ambulatory Clinical Support [facility] Direct Dial: [phone]

## 2022-05-11 NOTE — Patient Instructions (Signed)
[name] , Thank you for taking time to come for your Medicare Wellness Visit. I appreciate your ongoing commitment to your health goals. Please review the following plan we discussed and let me know if I can assist you in the future.   These are the goals we discussed:  Goals      Patient Stated     Would like to gain some weight     Patient Stated     Gain weight, planing for retirement years        This is a list of the screening recommended for you and due dates:  Health Maintenance  Topic Date Due   Pneumonia Vaccine (2 of 2 - PCV) 05/05/2021   COVID-19 Vaccine (6 - 2023-24 season) 09/22/2021   Flu Shot  08/23/2022   Mammogram  10/12/2022   Medicare Annual Wellness Visit  05/11/2023   DTaP/Tdap/Td vaccine (2 - Td or Tdap) 03/02/2025   Colon Cancer Screening  08/17/2030   DEXA scan (bone density measurement)  Completed   Hepatitis C Screening: USPSTF Recommendation to screen - Ages 90-79 yo.  Completed   Zoster (Shingles) Vaccine  Completed   HPV Vaccine  Aged Out    Advanced directives:    Discussed will pick up copy from office for completions   Conditions/risks identified:   Keep up the good work   Next appointment: Follow up in one year for your annual wellness visit:  05/14/23   Preventive Care 65 Years and Older, Female Preventive care refers to lifestyle choices and visits with your health care provider that can promote health and wellness. What does preventive care include? A yearly physical exam. This is also called an annual well check. Dental exams once or twice a year. Routine eye exams. Ask your health care provider how often you should have your eyes checked. Personal lifestyle choices, including: Daily care of your teeth and gums. Regular physical activity. Eating a healthy diet. Avoiding tobacco and drug use. Limiting alcohol use. Practicing safe sex. Taking low-dose aspirin every day. Taking vitamin and mineral supplements as recommended by  your health care provider. What happens during an annual well check? The services and screenings done by your health care provider during your annual well check will depend on your age, overall health, lifestyle risk factors, and family history of disease. Counseling  Your health care provider may ask you questions about your: Alcohol use. Tobacco use. Drug use. Emotional well-being. Home and relationship well-being. Sexual activity. Eating habits. History of falls. Memory and ability to understand (cognition). Work and work Astronomer. Reproductive health. Screening  You may have the following tests or measurements: Height, weight, and BMI. Blood pressure. Lipid and cholesterol levels. These may be checked every 5 years, or more frequently if you are over 105 years old. Skin check. Lung cancer screening. You may have this screening every year starting at age 75 if you have a 30-pack-year history of smoking and currently smoke or have quit within the past 15 years. Fecal occult blood test (FOBT) of the stool. You may have this test every year starting at age 52. Flexible sigmoidoscopy or colonoscopy. You may have a sigmoidoscopy every 5 years or a colonoscopy every 10 years starting at age 46. Hepatitis C blood test. Hepatitis B blood test. Sexually transmitted disease (STD) testing. Diabetes screening. This is done by checking your blood sugar (glucose) after you have not eaten for a while (fasting). You may have this done every 1-3 years. Bone  density scan. This is done to screen for osteoporosis. You may have this done starting at age 56. Mammogram. This may be done every 1-2 years. Talk to your health care provider about how often you should have regular mammograms. Talk with your health care provider about your test results, treatment options, and if necessary, the need for more tests. Vaccines  Your health care provider may recommend certain vaccines, such as: Influenza  vaccine. This is recommended every year. Tetanus, diphtheria, and acellular pertussis (Tdap, Td) vaccine. You may need a Td booster every 10 years. Zoster vaccine. You may need this after age 75. Pneumococcal 13-valent conjugate (PCV13) vaccine. One dose is recommended after age 68. Pneumococcal polysaccharide (PPSV23) vaccine. One dose is recommended after age 72. Talk to your health care provider about which screenings and vaccines you need and how often you need them. This information is not intended to replace advice given to you by your health care provider. Make sure you discuss any questions you have with your health care provider. Document Released: 02/04/2015 Document Revised: 09/28/2015 Document Reviewed: 11/09/2014 Elsevier Interactive Patient Education  2017 [employer] Prevention in the Home Falls can cause injuries. They can happen to people of all ages. There are many things you can do to make your home safe and to help prevent falls. What can I do on the outside of my home? Regularly fix the edges of walkways and driveways and fix any cracks. Remove anything that might make you trip as you walk through a door, such as a raised step or threshold. Trim any bushes or trees on the path to your home. Use bright outdoor lighting. Clear any walking paths of anything that might make someone trip, such as rocks or tools. Regularly check to see if handrails are loose or broken. Make sure that both sides of any steps have handrails. Any raised decks and porches should have guardrails on the edges. Have any leaves, snow, or ice cleared regularly. Use sand or salt on walking paths during winter. Clean up any spills in your garage right away. This includes oil or grease spills. What can I do in the bathroom? Use night lights. Install grab bars by the toilet and in the tub and shower. Do not use towel bars as grab bars. Use non-skid mats or decals in the tub or shower. If you  need to sit down in the shower, use a plastic, non-slip stool. Keep the floor dry. Clean up any water that spills on the floor as soon as it happens. Remove soap buildup in the tub or shower regularly. Attach bath mats securely with double-sided non-slip rug tape. Do not have throw rugs and other things on the floor that can make you trip. What can I do in the bedroom? Use night lights. Make sure that you have a light by your bed that is easy to reach. Do not use any sheets or blankets that are too big for your bed. They should not hang down onto the floor. Have a firm chair that has side arms. You can use this for support while you get dressed. Do not have throw rugs and other things on the floor that can make you trip. What can I do in the kitchen? Clean up any spills right away. Avoid walking on wet floors. Keep items that you use a lot in easy-to-reach places. If you need to reach something above you, use a strong step stool that has a grab bar. Keep  electrical cords out of the way. Do not use floor polish or wax that makes floors slippery. If you must use wax, use non-skid floor wax. Do not have throw rugs and other things on the floor that can make you trip. What can I do with my stairs? Do not leave any items on the stairs. Make sure that there are handrails on both sides of the stairs and use them. Fix handrails that are broken or loose. Make sure that handrails are as long as the stairways. Check any carpeting to make sure that it is firmly attached to the stairs. Fix any carpet that is loose or worn. Avoid having throw rugs at the top or bottom of the stairs. If you do have throw rugs, attach them to the floor with carpet tape. Make sure that you have a light switch at the top of the stairs and the bottom of the stairs. If you do not have them, ask someone to add them for you. What else can I do to help prevent falls? Wear shoes that: Do not have high heels. Have rubber  bottoms. Are comfortable and fit you well. Are closed at the toe. Do not wear sandals. If you use a stepladder: Make sure that it is fully opened. Do not climb a closed stepladder. Make sure that both sides of the stepladder are locked into place. Ask someone to hold it for you, if possible. Clearly mark and make sure that you can see: Any grab bars or handrails. First and last steps. Where the edge of each step is. Use tools that help you move around (mobility aids) if they are needed. These include: Canes. Walkers. Scooters. Crutches. Turn on the lights when you go into a dark area. Replace any light bulbs as soon as they burn out. Set up your furniture so you have a clear path. Avoid moving your furniture around. If any of your floors are uneven, fix them. If there are any pets around you, be aware of where they are. Review your medicines with your doctor. Some medicines can make you feel dizzy. This can increase your chance of falling. Ask your doctor what other things that you can do to help prevent falls. This information is not intended to replace advice given to you by your health care provider. Make sure you discuss any questions you have with your health care provider. Document Released: 11/04/2008 Document Revised: 06/16/2015 Document Reviewed: 02/12/2014 Elsevier Interactive Patient Education  2017 [employer].

## 2022-05-11 NOTE — Progress Notes (Signed)
Subjective:   [name] is a 67 y.o. female who presents for Medicare Annual (Subsequent) preventive examination.  I connected with  Jamirra B Mifflin on 05/11/22 by a audio enabled telemedicine application and verified that I am speaking with the correct person using two identifiers.  Patient Location: Home  Provider Location: Home Office  I discussed the limitations of evaluation and management by telemedicine. The patient expressed understanding and agreed to proceed.    Review of Systems     Cardiac Risk Factors include: advanced age (>71men, >30 women);dyslipidemia     Objective:    Today's Vitals   05/11/22 1224  Weight: 94 lb (42.6 kg)  Height:  (1.6 m)   Body mass index is 16.65 kg/m.     05/11/2022   12:29 PM 05/11/2022    8:37 AM 05/09/2021    1:41 PM 04/21/2020   12:38 PM 04/28/2015    7:18 AM 04/14/2015    1:06 PM  Advanced Directives  Does Patient Have a Medical Advance Directive? No No No No No No  Would patient like information on creating a medical advance directive? No - Patient declined No - Patient declined Yes (MAU/Ambulatory/Procedural Areas - Information given) Yes (MAU/Ambulatory/Procedural Areas - Information given)      Current Medications (verified) Outpatient Encounter Medications as of 05/11/2022  Medication Sig   aspirin 81 MG tablet Take 81 mg by mouth daily.   Calcium Citrate-Vitamin D 200-250 MG-UNIT TABS    Lactobacillus-Inulin (CULTURELLE DIGESTIVE HEALTH) CAPS    MegaRed Omega-3 Krill Oil 500 MG CAPS    Multiple Vitamins-Minerals (MULTIVITAMIN WITH MINERALS) tablet Take 1 tablet by mouth daily.   No facility-administered encounter medications on file as of 05/11/2022.    Allergies (verified) Duricef [cefadroxil monohydrate]   History: Past Medical History:  Diagnosis Date   Hyperlipidemia    Osteopenia    Past Surgical History:  Procedure Laterality Date   APPENDECTOMY  01/22/2010   COLONOSCOPY     Family History   Problem Relation Age of Onset   Heart disease Father    Stroke Mother    Colon cancer Neg Hx    Esophageal cancer Neg Hx    Rectal cancer Neg Hx    Stomach cancer Neg Hx    Social History   Socioeconomic History   Marital status: Married    Spouse name: Not on file   Number of children: Not on file   Years of education: Not on file   Highest education level: Not on file  Occupational History   Occupation: retired  Tobacco Use   Smoking status: Never   Smokeless tobacco: Never  Vaping Use   Vaping Use: Never used  Substance and Sexual Activity   Alcohol use: Yes    Alcohol/week: 3.0 standard drinks of alcohol    Types: 3 Glasses of wine per week    Comment: wine daily   Drug use: No   Sexual activity: Yes    Birth control/protection: Post-menopausal  Other Topics Concern   Not on file  Social History Narrative   Exercise 6 days a week   Live at home with husband   Social Determinants of Health   Financial Resource Strain: Low Risk  (05/11/2022)   Overall Financial Resource Strain (CARDIA)    Difficulty of Paying Living Expenses: Not hard at all  Food Insecurity: No Food Insecurity (05/11/2022)   Hunger Vital Sign    Worried About Running Out of Food in the  Last Year: Never true    Ran Out of Food in the Last Year: Never true  Transportation Needs: No Transportation Needs (05/11/2022)   PRAPARE - Administrator, Civil Service (Medical): No    Lack of Transportation (Non-Medical): No  Physical Activity: Sufficiently Active (05/11/2022)   Exercise Vital Sign    Days of Exercise per Week: 6 days    Minutes of Exercise per Session: 60 min  Stress: No Stress Concern Present (05/11/2022)   [employer] of Occupational Health - Occupational Stress Questionnaire    Feeling of Stress : Not at all  Social Connections: Moderately Integrated (05/11/2022)   Social Connection and Isolation Panel [NHANES]    Frequency of Communication with Friends and Family:  More than three times a week    Frequency of Social Gatherings with Friends and Family: Once a week    Attends Religious Services: Never    Database administrator or Organizations: No    Attends Engineer, structural: More than 4 times per year    Marital Status: Married    Tobacco Counseling Counseling given: Not Answered   Clinical Intake:  Pre-visit preparation completed: Yes  Pain : No/denies pain     BMI - recorded: 16.65 Nutritional Status: BMI of 19-24  Normal Nutritional Risks: None Diabetes: No  How often do you need to have someone help you when you read instructions, pamphlets, or other written materials from your doctor or pharmacy?: 1 - Never  Diabetic?  No  Interpreter Needed?: No  Information entered by :: Kandis Cocking, CMA   Activities of Daily Living    05/11/2022   12:29 PM 05/11/2022    8:41 AM  In your present state of health, do you have any difficulty performing the following activities:  Hearing? 0 0  Vision? 0 0  Difficulty concentrating or making decisions? 0 0  Walking or climbing stairs? 0 0  Dressing or bathing? 0 0  Doing errands, shopping? 0 0  Preparing Food and eating ? N N  Using the Toilet? N N  In the past six months, have you accidently leaked urine? N N  Do you have problems with loss of bowel control? N N  Managing your Medications? N N  Managing your Finances? N N  Housekeeping or managing your Housekeeping? N N    Patient Care Team: Zola Button, Grayling Congress, DO as PCP - General (Family Medicine) [place], Elvin So, MD as Referring Physician (Dermatology) Nelson Chimes, MD as Consulting Physician (Ophthalmology)  Indicate any recent Medical Services you may have received from other than [facility] providers in the past year (date may be approximate).     Assessment:   This is a routine wellness examination for [name].  Hearing/Vision screen Hearing Screening - Comments:: Denies hearing difficulties     Vision Screening - Comments::  Up to date with routine eye exams with  Holley Raring on April 11th  Dietary issues and exercise activities discussed: Current Exercise Habits: Home exercise routine;Structured exercise class, Type of exercise: strength training/weights;yoga;walking, Time (Minutes): 60, Frequency (Times/Week): 6, Weekly Exercise (Minutes/Week): 360, Intensity: Mild   Goals Addressed             This Visit's Progress    Patient Stated   On track    Would like to gain some weight       Depression Screen    05/11/2022   12:31 PM 05/11/2022    8:50 AM 05/09/2021  1:40 PM 04/21/2020   12:41 PM 04/06/2019    8:21 AM  PHQ 2/9 Scores  PHQ - 2 Score 0 0 0 0 0    Fall Risk    05/11/2022   12:29 PM 05/11/2022    8:35 AM 05/09/2021    1:43 PM 04/21/2020   12:41 PM 04/06/2019    8:49 AM  Fall Risk   Falls in the past year? 0 0 0 0 0  Number falls in past yr: 0 0 0 0 0  Injury with Fall? 0 0 0 0 0  Risk for fall due to : No Fall Risks No Fall Risks     Follow up Falls prevention discussed Falls prevention discussed Falls prevention discussed Falls prevention discussed     FALL RISK PREVENTION PERTAINING TO THE HOME:  Any stairs in or around the home? Yes  If so, are there any without handrails? No  Home free of loose throw rugs in walkways, pet beds, electrical cords, etc? No  Adequate lighting in your home to reduce risk of falls? Yes   ASSISTIVE DEVICES UTILIZED TO PREVENT FALLS:  Life alert? No  Use of a cane, walker or w/c? No  Grab bars in the bathroom? No  Shower chair or bench in shower? Yes  Elevated toilet seat or a handicapped toilet? No   TIMED UP AND GO:  Was the test performed? No .  Televisit  Cognitive Function:    04/06/2019    8:52 AM  MMSE - Mini Mental State Exam  Orientation to time 5  Orientation to Place 5  Registration 3  Attention/ Calculation 5  Recall 3  Language- name 2 objects 2  Language- repeat 1  Language- follow 3  step command 3  Language- read & follow direction 1  Write a sentence 1  Copy design 1  Total score 30        05/11/2022   12:30 PM 05/11/2022    8:43 AM 05/09/2021    1:44 PM  6CIT Screen  What Year? 0 points 0 points 0 points  What month? 0 points 0 points 0 points  What time? 0 points 0 points 0 points  Count back from 20 0 points 0 points 0 points  Months in reverse 0 points 0 points 0 points  Repeat phrase 0 points 0 points 0 points  Total Score 0 points 0 points 0 points    Immunizations Immunization History  Administered Date(s) Administered   Influenza Split 10/26/2019   Influenza,inj,Quad PF,6+ Mos 10/12/2015   Influenza-Unspecified 10/23/2014, 10/29/2016, 10/09/2017, 10/27/2018   PFIZER(Purple Top)SARS-COV-2 Vaccination 03/05/2019, 03/30/2019, 10/26/2019, 05/18/2020, 10/04/2020   Pneumococcal Polysaccharide-23 05/05/2020   Tdap 03/03/2015   Zoster Recombinat (Shingrix) 06/05/2016, 10/08/2016   Zoster, Live 03/03/2015    TDAP status: Up to date  Flu Vaccine status: Up to date  Pneumococcal vaccine status: Due, Education has been provided regarding the importance of this vaccine. Advised may receive this vaccine at local pharmacy or Health Dept. Aware to provide a copy of the vaccination record if obtained from local pharmacy or Health Dept. Verbalized acceptance and understanding.  Covid-19 vaccine status: Information provided on how to obtain vaccines.   Qualifies for Shingles Vaccine? Yes   Zostavax completed Yes   Shingrix Completed?: Yes  Screening Tests Health Maintenance  Topic Date Due   Pneumonia Vaccine 65+ Years old (2 of 2 - PCV) 05/05/2021   COVID-19 Vaccine (6 - 2023-24 season) 09/22/2021   INFLUENZA VACCINE  08/23/2022   MAMMOGRAM  10/12/2022   Medicare Annual Wellness (AWV)  05/11/2023   DTaP/Tdap/Td (2 - Td or Tdap) 03/02/2025   COLONOSCOPY (Pts 45-32yrs Insurance coverage will need to be confirmed)  08/17/2030   DEXA SCAN  Completed    Hepatitis C Screening  Completed   Zoster Vaccines- Shingrix  Completed   HPV VACCINES  Aged Out    Health Maintenance  Health Maintenance Due  Topic Date Due   Pneumonia Vaccine 9+ Years old (2 of 2 - PCV) 05/05/2021   COVID-19 Vaccine (6 - 2023-24 season) 09/22/2021    Colorectal cancer screening: Type of screening: Colonoscopy. Completed 07/27/20   . Repeat every 10 years  Mammogram status: Completed  . Repeat every year  Bone Density status: Completed 11/07/2020    . Results reflect: Bone density results: OSTEOPENIA. Repeat every 2 years.  Lung Cancer Screening: (Low Dose CT Chest recommended if Age 68-80 years, 30 pack-year currently smoking OR have quit w/in 15years.) does not qualify.   Lung Cancer Screening Referral: N/A  Additional Screening:  Hepatitis C Screening: does qualify; Completed 03/03/2015  Vision Screening: Recommended annual ophthalmology exams for early detection of glaucoma and other disorders of the eye. Is the patient up to date with their annual eye exam?  Yes   Who is the provider or what is the name of the office in which the patient attends annual eye exams? [facility]  If pt is not established with a provider, would they like to be referred to a provider to establish care? No .   Dental Screening: Recommended annual dental exams for proper oral hygiene  Community Resource Referral / Chronic Care Management: CRR required this visit?  No   CCM required this visit?  No      Plan:     I have personally reviewed and noted the following in the patient's chart:   Medical and social history Use of alcohol, tobacco or illicit drugs  Current medications and supplements including opioid prescriptions. Patient is not currently taking opioid prescriptions. Functional ability and status Nutritional status Physical activity Advanced directives List of other physicians Hospitalizations, surgeries, and ER visits in previous 12  months Vitals Screenings to include cognitive, depression, and falls Referrals and appointments  In addition, I have reviewed and discussed with patient certain preventive protocols, quality metrics, and best practice recommendations. A written personalized care plan for preventive services as well as general preventive health recommendations were provided to patient.     Milus Mallick, CMA   05/11/2022   Nurse Notes: No concerns

## 2022-05-11 NOTE — Progress Notes (Unsigned)
Subjective:   [name] is a 67 y.o. female who presents for Medicare Annual (Subsequent) preventive examination.  I connected with  Maureen B Picone on 05/11/22 by a audio enabled telemedicine application and verified that I am speaking with the correct person using two identifiers.  Patient Location: Home  Provider Location: Home Office  I discussed the limitations of evaluation and management by telemedicine. The patient expressed understanding and agreed to proceed.      Review of Systems    Cardiac Risk Factors include: advanced age (>30men, >18 women);dyslipidemia     Objective:    Today's Vitals   05/11/22 0833  Weight: 94 lb (42.6 kg)  Height:  (1.6 m)   Body mass index is 16.65 kg/m.     05/11/2022    8:37 AM 05/09/2021    1:41 PM 04/21/2020   12:38 PM 04/28/2015    7:18 AM 04/14/2015    1:06 PM  Advanced Directives  Does Patient Have a Medical Advance Directive? No No No No No  Would patient like information on creating a medical advance directive? No - Patient declined Yes (MAU/Ambulatory/Procedural Areas - Information given) Yes (MAU/Ambulatory/Procedural Areas - Information given)      Current Medications (verified) Outpatient Encounter Medications as of 05/11/2022  Medication Sig   aspirin 81 MG tablet Take 81 mg by mouth daily.   Calcium Citrate-Vitamin D 200-250 MG-UNIT TABS    Lactobacillus-Inulin (CULTURELLE DIGESTIVE HEALTH) CAPS    MegaRed Omega-3 Krill Oil 500 MG CAPS    Multiple Vitamins-Minerals (MULTIVITAMIN WITH MINERALS) tablet Take 1 tablet by mouth daily.   No facility-administered encounter medications on file as of 05/11/2022.    Allergies (verified) Duricef [cefadroxil monohydrate]   History: Past Medical History:  Diagnosis Date   Hyperlipidemia    Osteopenia    Past Surgical History:  Procedure Laterality Date   APPENDECTOMY  01/22/2010   COLONOSCOPY     Family History  Problem Relation Age of Onset   Heart disease  Father    Stroke Mother    Colon cancer Neg Hx    Esophageal cancer Neg Hx    Rectal cancer Neg Hx    Stomach cancer Neg Hx    Social History   Socioeconomic History   Marital status: Married    Spouse name: Not on file   Number of children: Not on file   Years of education: Not on file   Highest education level: Not on file  Occupational History   Occupation: retired  Tobacco Use   Smoking status: Never   Smokeless tobacco: Never  Vaping Use   Vaping Use: Never used  Substance and Sexual Activity   Alcohol use: Yes    Alcohol/week: 3.0 standard drinks of alcohol    Types: 3 Glasses of wine per week    Comment: wine daily   Drug use: No   Sexual activity: Yes    Birth control/protection: Post-menopausal  Other Topics Concern   Not on file  Social History Narrative   Exercise 5 days a week   Live at home with husband   Social Determinants of Health   Financial Resource Strain: Low Risk  (05/11/2022)   Overall Financial Resource Strain (CARDIA)    Difficulty of Paying Living Expenses: Not hard at all  Food Insecurity: No Food Insecurity (05/11/2022)   Hunger Vital Sign    Worried About Running Out of Food in the Last Year: Never true    Ran Out  of Food in the Last Year: Never true  Transportation Needs: No Transportation Needs (05/11/2022)   PRAPARE - Administrator, Civil Service (Medical): No    Lack of Transportation (Non-Medical): No  Physical Activity: Sufficiently Active (05/11/2022)   Exercise Vital Sign    Days of Exercise per Week: 6 days    Minutes of Exercise per Session: 60 min  Stress: No Stress Concern Present (05/11/2022)   [employer] of Occupational Health - Occupational Stress Questionnaire    Feeling of Stress : Not at all  Social Connections: Moderately Integrated (05/11/2022)   Social Connection and Isolation Panel [NHANES]    Frequency of Communication with Friends and Family: More than three times a week    Frequency of Social  Gatherings with Friends and Family: Once a week    Attends Religious Services: Never    Database administrator or Organizations: No    Attends Engineer, structural: More than 4 times per year    Marital Status: Married    Tobacco Counseling Counseling given: Not Answered   Clinical Intake:  Pre-visit preparation completed: Yes  Pain : No/denies pain     BMI - recorded: 16.65 Nutritional Status: BMI <19  Underweight Nutritional Risks: None Diabetes: No  How often do you need to have someone help you when you read instructions, pamphlets, or other written materials from your doctor or pharmacy?: 1 - Never  Diabetic?  No  Interpreter Needed?: No  Information entered by :: Kandis Cocking, CMA   Activities of Daily Living    05/11/2022    8:41 AM  In your present state of health, do you have any difficulty performing the following activities:  Hearing? 0  Vision? 0  Difficulty concentrating or making decisions? 0  Walking or climbing stairs? 0  Dressing or bathing? 0  Doing errands, shopping? 0  Preparing Food and eating ? N  Using the Toilet? N  In the past six months, have you accidently leaked urine? N  Do you have problems with loss of bowel control? N  Managing your Medications? N  Managing your Finances? N  Housekeeping or managing your Housekeeping? N    Patient Care Team: Zola Button, Grayling Congress, DO as PCP - General (Family Medicine) [place], Elvin So, MD as Referring Physician (Dermatology) Nelson Chimes, MD as Consulting Physician (Ophthalmology)  Indicate any recent Medical Services you may have received from other than [facility] providers in the past year (date may be approximate).     Assessment:   This is a routine wellness examination for [name].  Hearing/Vision screen Hearing Screening - Comments:: Denies hearing difficulties   Vision Screening - Comments:: Up to date with routine eye exams with- April 11 th [facility]  Dietary issues and exercise activities discussed: Current Exercise Habits: Home exercise routine;Structured exercise class, Type of exercise: walking;strength training/weights;yoga, Frequency (Times/Week): 6, Intensity: Mild   Goals Addressed             This Visit's Progress    Patient Stated   On track    Would like to gain some weight     Patient Stated       Gain weight, planing for retirement years       Depression Screen    05/11/2022    8:50 AM 05/09/2021    1:40 PM 04/21/2020   12:41 PM 04/06/2019    8:21 AM  PHQ 2/9 Scores  PHQ - 2 Score  0 0 0 0    Fall Risk    05/11/2022    8:35 AM 05/09/2021    1:43 PM 04/21/2020   12:41 PM 04/06/2019    8:49 AM  Fall Risk   Falls in the past year? 0 0 0 0  Number falls in past yr: 0 0 0 0  Injury with Fall? 0 0 0 0  Risk for fall due to : No Fall Risks     Follow up Falls prevention discussed Falls prevention discussed Falls prevention discussed     FALL RISK PREVENTION PERTAINING TO THE HOME:  Any stairs in or around the home? Yes  If so, are there any without handrails? No  Home free of loose throw rugs in walkways, pet beds, electrical cords, etc? No  Adequate lighting in your home to reduce risk of falls? Yes   ASSISTIVE DEVICES UTILIZED TO PREVENT FALLS:  Life alert? No  Use of a cane, walker or w/c? No  Grab bars in the bathroom? No  Shower chair or bench in shower? Yes  Elevated toilet seat or a handicapped toilet? No   TIMED UP AND GO:  Was the test performed? No .  Televisit   Cognitive Function:    04/06/2019    8:52 AM  MMSE - Mini Mental State Exam  Orientation to time 5  Orientation to Place 5  Registration 3  Attention/ Calculation 5  Recall 3  Language- name 2 objects 2  Language- repeat 1  Language- follow 3 step command 3  Language- read & follow direction 1  Write a sentence 1  Copy design 1  Total score 30        05/11/2022    8:43 AM 05/09/2021    1:44 PM  6CIT Screen   What Year? 0 points 0 points  What month? 0 points 0 points  What time? 0 points 0 points  Count back from 20 0 points 0 points  Months in reverse 0 points 0 points  Repeat phrase 0 points 0 points  Total Score 0 points 0 points    Immunizations Immunization History  Administered Date(s) Administered   Influenza Split 10/26/2019   Influenza,inj,Quad PF,6+ Mos 10/12/2015   Influenza-Unspecified 10/23/2014, 10/29/2016, 10/09/2017, 10/27/2018   PFIZER(Purple Top)SARS-COV-2 Vaccination 03/05/2019, 03/30/2019, 10/26/2019, 05/18/2020, 10/04/2020   Pneumococcal Polysaccharide-23 05/05/2020   Tdap 03/03/2015   Zoster Recombinat (Shingrix) 06/05/2016, 10/08/2016   Zoster, Live 03/03/2015    TDAP status: Up to date  Flu Vaccine status: Up to date  Pneumococcal vaccine status: Due, Education has been provided regarding the importance of this vaccine. Advised may receive this vaccine at local pharmacy or Health Dept. Aware to provide a copy of the vaccination record if obtained from local pharmacy or Health Dept. Verbalized acceptance and understanding.  Covid-19 vaccine status: Information provided on how to obtain vaccines.   Qualifies for Shingles Vaccine? Yes   Zostavax completed Yes   Shingrix Completed?: Yes  Screening Tests Health Maintenance  Topic Date Due   Pneumonia Vaccine 85+ Years old (2 of 2 - PCV) 05/05/2021   COVID-19 Vaccine (6 - 2023-24 season) 09/22/2021   INFLUENZA VACCINE  08/23/2022   MAMMOGRAM  10/12/2022   Medicare Annual Wellness (AWV)  05/11/2023   DTaP/Tdap/Td (2 - Td or Tdap) 03/02/2025   COLONOSCOPY (Pts 45-59yrs Insurance coverage will need to be confirmed)  08/17/2030   DEXA SCAN  Completed   Hepatitis C Screening  Completed   Zoster Vaccines- Shingrix  Completed   HPV VACCINES  Aged Out    Health Maintenance  Health Maintenance Due  Topic Date Due   Pneumonia Vaccine 88+ Years old (2 of 2 - PCV) 05/05/2021   COVID-19 Vaccine (6 - 2023-24  season) 09/22/2021    Colorectal cancer screening: Type of screening: Colonoscopy. Completed 08/16/20. Repeat every 10 years  Mammogram status: Completed 10/11/2021   . Repeat every year  Bone Density status: Completed 11/07/20. Results reflect: Bone density results: OSTEOPENIA. Repeat every 2 years.  Lung Cancer Screening: (Low Dose CT Chest recommended if Age 50-80 years, 30 pack-year currently smoking OR have quit w/in 15years.) does not qualify.   Lung Cancer Screening Referral: N/A  Additional Screening:  Hepatitis C Screening: does qualify; Completed 03/03/2015  Vision Screening: Recommended annual ophthalmology exams for early detection of glaucoma and other disorders of the eye.  Is the patient up to date with their annual eye exam?  Yes   Who is the provider or what is the name of the office in which the patient attends annual eye exams? [facility]  If pt is not established with a provider, would they like to be referred to a provider to establish care? No .   Dental Screening: Recommended annual dental exams for proper oral hygiene  Community Resource Referral / Chronic Care Management: CRR required this visit?  No   CCM required this visit?  No      Plan:     I have personally reviewed and noted the following in the patient's chart:   Medical and social history Use of alcohol, tobacco or illicit drugs  Current medications and supplements including opioid prescriptions. Patient is not currently taking opioid prescriptions. Functional ability and status Nutritional status Physical activity Advanced directives List of other physicians Hospitalizations, surgeries, and ER visits in previous 12 months Vitals Screenings to include cognitive, depression, and falls Referrals and appointments  In addition, I have reviewed and discussed with patient certain preventive protocols, quality metrics, and best practice recommendations. A written personalized care plan for  preventive services as well as general preventive health recommendations were provided to patient.     Milus Mallick, CMA   05/11/2022   Nurse Notes:  No concerns

## 2022-05-14 NOTE — Progress Notes (Signed)
New encounter was created due to previous appt being cancelled

## 2022-08-20 ENCOUNTER — Ambulatory Visit (INDEPENDENT_AMBULATORY_CARE_PROVIDER_SITE_OTHER): Payer: Medicare Other | Admitting: Family Medicine

## 2022-08-20 ENCOUNTER — Encounter: Payer: Self-pay | Admitting: Family Medicine

## 2022-08-20 ENCOUNTER — Telehealth (HOSPITAL_BASED_OUTPATIENT_CLINIC_OR_DEPARTMENT_OTHER): Payer: Self-pay

## 2022-08-20 VITALS — BP 132/88 | HR 78 | Temp 97.8°F | Resp 18 | Ht 63.0 in | Wt 96.4 lb

## 2022-08-20 DIAGNOSIS — E785 Hyperlipidemia, unspecified: Secondary | ICD-10-CM | POA: Diagnosis not present

## 2022-08-20 DIAGNOSIS — Z23 Encounter for immunization: Secondary | ICD-10-CM | POA: Diagnosis not present

## 2022-08-20 DIAGNOSIS — R636 Underweight: Secondary | ICD-10-CM

## 2022-08-20 LAB — TSH: TSH: 1.33 u[IU]/mL (ref 0.35–5.50)

## 2022-08-20 LAB — CBC WITH DIFFERENTIAL/PLATELET
Basophils Absolute: 0.1 10*3/uL (ref 0.0–0.1)
Basophils Relative: 1.2 % (ref 0.0–3.0)
Eosinophils Absolute: 0.1 10*3/uL (ref 0.0–0.7)
Eosinophils Relative: 2 % (ref 0.0–5.0)
HCT: 42.8 % (ref 36.0–46.0)
Hemoglobin: 14.4 g/dL (ref 12.0–15.0)
Lymphocytes Relative: 24.9 % (ref 12.0–46.0)
Lymphs Abs: 1.1 10*3/uL (ref 0.7–4.0)
MCHC: 33.5 g/dL (ref 30.0–36.0)
MCV: 98.7 fl (ref 78.0–100.0)
Monocytes Absolute: 0.4 10*3/uL (ref 0.1–1.0)
Monocytes Relative: 9.4 % (ref 3.0–12.0)
Neutro Abs: 2.7 10*3/uL (ref 1.4–7.7)
Neutrophils Relative %: 62.5 % (ref 43.0–77.0)
Platelets: 327 10*3/uL (ref 150.0–400.0)
RBC: 4.34 Mil/uL (ref 3.87–5.11)
RDW: 12.4 % (ref 11.5–15.5)
WBC: 4.3 10*3/uL (ref 4.0–10.5)

## 2022-08-20 LAB — COMPREHENSIVE METABOLIC PANEL
ALT: 19 U/L (ref 0–35)
AST: 17 U/L (ref 0–37)
Albumin: 4.3 g/dL (ref 3.5–5.2)
Alkaline Phosphatase: 44 U/L (ref 39–117)
BUN: 16 mg/dL (ref 6–23)
CO2: 30 mEq/L (ref 19–32)
Calcium: 9.5 mg/dL (ref 8.4–10.5)
Chloride: 99 mEq/L (ref 96–112)
Creatinine, Ser: 0.72 mg/dL (ref 0.40–1.20)
GFR: 85.85 mL/min (ref >60.00)
Glucose, Bld: 98 mg/dL (ref 70–99)
Potassium: 4.8 mEq/L (ref 3.5–5.1)
Sodium: 137 mEq/L (ref 135–145)
Total Bilirubin: 0.7 mg/dL (ref 0.2–1.2)
Total Protein: 6.7 g/dL (ref 6.0–8.3)

## 2022-08-20 LAB — LIPID PANEL
Cholesterol: 218 mg/dL — ABNORMAL HIGH (ref 0–200)
HDL: 70.1 mg/dL (ref >39.00)
LDL Cholesterol: 135 mg/dL — ABNORMAL HIGH (ref 0–99)
NonHDL: 148.33
Total CHOL/HDL Ratio: 3
Triglycerides: 69 mg/dL (ref 0.0–149.0)
VLDL: 13.8 mg/dL (ref 0.0–40.0)

## 2022-08-20 NOTE — Assessment & Plan Note (Signed)
Stable

## 2022-08-20 NOTE — Progress Notes (Signed)
Established Patient Office Visit  Subjective   Patient ID: [name], female    DOB: 1955-02-15  Age: 67 y.o. MRN: [id number]  Chief Complaint  Patient presents with   Annual Exam    Pt states fastin     HPI Discussed the use of AI scribe software for clinical note transcription with the patient, who gave verbal consent to proceed.  History of Present Illness   The patient presents for a routine check-up. She reports no new symptoms or changes in her health status. She is currently taking over-the-counter medications and is fasting in preparation for any necessary lab work. The patient is up-to-date with her mammogram and is due for her second pneumonia shot. She has received five COVID-19 vaccinations and is aware of the increasing prevalence of the virus. The patient exercises daily and lives at home with her husband. She has no pets but pet-sits frequently. The patient sees an eye doctor and a dentist regularly.      Patient Active Problem List   Diagnosis Date Noted   Underweight 05/05/2020   Preventative health care 03/26/2017   Basal cell carcinoma (BCC) in situ of skin 03/12/2017   Hyperlipidemia 12/27/2011   Acute appendicitis 11/30/2010   Past Medical History:  Diagnosis Date   Hyperlipidemia    Osteopenia    Past Surgical History:  Procedure Laterality Date   APPENDECTOMY  01/22/2010   COLONOSCOPY     Social History   Tobacco Use   Smoking status: Never   Smokeless tobacco: Never  Vaping Use   Vaping status: Never Used  Substance Use Topics   Alcohol use: Yes    Alcohol/week: 3.0 standard drinks of alcohol    Types: 3 Glasses of wine per week    Comment: wine daily   Drug use: No   Social History   Socioeconomic History   Marital status: Married    Spouse name: Not on file   Number of children: Not on file   Years of education: Not on file   Highest education level: Not on file  Occupational History   Occupation: retired  Tobacco Use    Smoking status: Never   Smokeless tobacco: Never  Vaping Use   Vaping status: Never Used  Substance and Sexual Activity   Alcohol use: Yes    Alcohol/week: 3.0 standard drinks of alcohol    Types: 3 Glasses of wine per week    Comment: wine daily   Drug use: No   Sexual activity: Yes    Partners: Male    Birth control/protection: Post-menopausal  Other Topics Concern   Not on file  Social History Narrative   Exercise 6 days a week   Live at home with husband   Social Determinants of Health   Financial Resource Strain: Low Risk  (05/11/2022)   Overall Financial Resource Strain (CARDIA)    Difficulty of Paying Living Expenses: Not hard at all  Food Insecurity: No Food Insecurity (05/11/2022)   Hunger Vital Sign    Worried About Running Out of Food in the Last Year: Never true    Ran Out of Food in the Last Year: Never true  Transportation Needs: No Transportation Needs (05/11/2022)   PRAPARE - Administrator, Civil Service (Medical): No    Lack of Transportation (Non-Medical): No  Physical Activity: Sufficiently Active (05/11/2022)   Exercise Vital Sign    Days of Exercise per Week: 6 days    Minutes of Exercise  per Session: 60 min  Stress: No Stress Concern Present (05/11/2022)   [employer] of Occupational Health - Occupational Stress Questionnaire    Feeling of Stress : Not at all  Social Connections: Moderately Integrated (05/11/2022)   Social Connection and Isolation Panel [NHANES]    Frequency of Communication with Friends and Family: More than three times a week    Frequency of Social Gatherings with Friends and Family: Once a week    Attends Religious Services: Never    Database administrator or Organizations: No    Attends Engineer, structural: More than 4 times per year    Marital Status: Married  Catering manager Violence: Not At Risk (05/11/2022)   Humiliation, Afraid, Rape, and Kick questionnaire    Fear of Current or Ex-Partner: No     Emotionally Abused: No    Physically Abused: No    Sexually Abused: No   Family Status  Relation Name Status   Father  Deceased   Mother  Deceased   Neg Hx  (Not Specified)  No partnership data on file   Family History  Problem Relation Age of Onset   Heart disease Father    Stroke Mother    Colon cancer Neg Hx    Esophageal cancer Neg Hx    Rectal cancer Neg Hx    Stomach cancer Neg Hx    Allergies  Allergen Reactions   Duricef [Cefadroxil Monohydrate] Rash      Review of Systems  Constitutional:  Negative for fever and malaise/fatigue.  HENT:  Negative for congestion.   Eyes:  Negative for blurred vision.  Respiratory:  Negative for shortness of breath.   Cardiovascular:  Negative for chest pain, palpitations and leg swelling.  Gastrointestinal:  Negative for abdominal pain, blood in stool and nausea.  Genitourinary:  Negative for dysuria and frequency.  Musculoskeletal:  Negative for falls.  Skin:  Negative for rash.  Neurological:  Negative for dizziness, loss of consciousness and headaches.  Endo/Heme/Allergies:  Negative for environmental allergies.  Psychiatric/Behavioral:  Negative for depression. The patient is not nervous/anxious.       Objective:     BP 132/88 (BP Location: Right Arm, Patient Position: Sitting, Cuff Size: Normal)   Pulse 78   Temp 97.8 F (36.6 C) (Oral)   Resp 18   Ht 5\' 3"  (1.6 m)   Wt 96 lb 6.4 oz (43.7 kg)   SpO2 99%   BMI 17.08 kg/m  BP Readings from Last 3 Encounters:  08/20/22 132/88  08/15/21 110/70  08/16/20 (!) 92/58   Wt Readings from Last 3 Encounters:  08/20/22 96 lb 6.4 oz (43.7 kg)  05/11/22 94 lb (42.6 kg)  05/11/22 94 lb (42.6 kg)   SpO2 Readings from Last 3 Encounters:  08/20/22 99%  08/16/20 (!) 60%  08/09/20 98%      Physical Exam Vitals and nursing note reviewed.  Constitutional:      General: She is not in acute distress.    Appearance: Normal appearance. She is well-developed.  HENT:      Head: Normocephalic and atraumatic.  Eyes:     General: No scleral icterus.       Right eye: No discharge.        Left eye: No discharge.  Cardiovascular:     Rate and Rhythm: Normal rate and regular rhythm.     Heart sounds: No murmur heard. Pulmonary:     Effort: Pulmonary effort is normal. No respiratory distress.  Breath sounds: Normal breath sounds.  Abdominal:     Palpations: Abdomen is soft.     Tenderness: There is no abdominal tenderness. There is no guarding or rebound.  Musculoskeletal:        General: Normal range of motion.     Cervical back: Normal range of motion and neck supple.     Right lower leg: No edema.     Left lower leg: No edema.  Skin:    General: Skin is warm and dry.  Neurological:     Mental Status: She is alert and oriented to person, place, and time.  Psychiatric:        Mood and Affect: Mood normal.        Behavior: Behavior normal.        Thought Content: Thought content normal.        Judgment: Judgment normal.      No results found for any visits on 08/20/22.  Last CBC Lab Results  Component Value Date   WBC 3.0 (L) 02/16/2022   HGB 14.9 02/16/2022   HCT 43.8 02/16/2022   MCV 99.7 02/16/2022   MCH 33.1 11/06/2010   RDW 12.8 02/16/2022   PLT 209.0 02/16/2022   Last metabolic panel Lab Results  Component Value Date   GLUCOSE 99 02/16/2022   NA 137 02/16/2022   K 4.7 02/16/2022   CL 98 02/16/2022   CO2 32 02/16/2022   BUN 14 02/16/2022   CREATININE 0.65 02/16/2022   GFR 90.72 02/16/2022   CALCIUM 9.4 02/16/2022   PROT 6.9 02/16/2022   ALBUMIN 4.5 02/16/2022   BILITOT 0.7 02/16/2022   ALKPHOS 45 02/16/2022   AST 21 02/16/2022   ALT 24 02/16/2022   Last lipids Lab Results  Component Value Date   CHOL 225 (H) 02/16/2022   HDL 94.10 02/16/2022   LDLCALC 118 (H) 02/16/2022   TRIG 67.0 02/16/2022   CHOLHDL 2 02/16/2022   Last hemoglobin A1c No results found for: "HGBA1C" Last thyroid functions Lab Results   Component Value Date   TSH 2.67 03/31/2018   Last vitamin D No results found for: "25OHVITD2", "25OHVITD3", "VD25OH" Last vitamin B12 and Folate No results found for: "VITAMINB12", "FOLATE"    The 10-year ASCVD risk score (Arnett DK, et al., 2019) is: 7.3%    Assessment & Plan:   Problem List Items Addressed This Visit       Unprioritized   Underweight    Stable       Hyperlipidemia - Primary    Encourage heart healthy diet such as MIND or DASH diet, increase exercise, avoid trans fats, simple carbohydrates and processed foods, consider a krill or fish or flaxseed oil cap daily.        Relevant Orders   CBC with Differential/Platelet   Comprehensive metabolic panel   Lipid panel   TSH   Lipoprotein A (LPA)   CT CARDIAC SCORING   Other Visit Diagnoses     Need for pneumococcal 20-valent conjugate vaccination       Relevant Orders   Pneumococcal conjugate vaccine 20-valent (Prevnar 20) (Completed)     .Assessment and Plan    Pneumococcal Vaccination: Received Pneumovax 23 on 05/05/20. Uncertain if Prevnar 13 was received. No harm in repeating vaccination. -Administer Prevnar 13 today.  Cardiovascular Risk Assessment: Discussed the utility of LP(a) and coronary artery calcium scoring for further risk stratification. -Order LP(a) with routine labs today. -Order coronary artery calcium score. Patient to schedule at her convenience.  General Health Maintenance: -Continue routine exercise and healthy lifestyle. -Complete DEXA scan. -Schedule annual physical for next year. -Check routine labs today including cholesterol, glucose, and thyroid function tests.       Return in about 1 year (around 08/20/2023) for annual exam, fasting.    Donato Schultz, DO

## 2022-08-20 NOTE — Assessment & Plan Note (Signed)
Encourage heart healthy diet such as MIND or DASH diet, increase exercise, avoid trans fats, simple carbohydrates and processed foods, consider a krill or fish or flaxseed oil cap daily.  °

## 2022-08-20 NOTE — Patient Instructions (Signed)
Dyslipidemia Dyslipidemia is an imbalance of waxy, fat-like substances (lipids) in the blood. The body needs lipids in small amounts. Dyslipidemia often involves a high level of cholesterol or triglycerides, which are types of lipids. Common forms of dyslipidemia include: High levels of LDL cholesterol. LDL is the type of cholesterol that causes fatty deposits (plaques) to build up in the blood vessels that carry blood away from the heart (arteries). Low levels of HDL cholesterol. HDL cholesterol is the type of cholesterol that protects against heart disease. High levels of HDL remove the LDL buildup from arteries. High levels of triglycerides. Triglycerides are a fatty substance in the blood that is linked to a buildup of plaques in the arteries. What are the causes? There are two main types of dyslipidemia: primary and secondary. Primary dyslipidemia is caused by changes (mutations) in genes that are passed down through families (inherited). These mutations cause several types of dyslipidemia. Secondary dyslipidemia may be caused by various risk factors that can lead to the disease, such as lifestyle choices and certain medical conditions. What increases the risk? You are more likely to develop this condition if you are an older man or if you are a woman who has gone through menopause. Other risk factors include: Having a family history of dyslipidemia. Taking certain medicines, including birth control pills, steroids, some diuretics, and beta-blockers. Eating a diet high in saturated fat. Smoking cigarettes or excessive alcohol intake. Having certain medical conditions such as diabetes, polycystic ovary syndrome (PCOS), kidney disease, liver disease, or hypothyroidism. Not exercising regularly. Being overweight or obese with too much belly fat. What are the signs or symptoms? In most cases, dyslipidemia does not usually cause any symptoms. In severe cases, very high lipid levels can  cause: Fatty bumps under the skin (xanthomas). A white or gray ring around the black center (pupil) of the eye. Very high triglyceride levels can cause inflammation of the pancreas (pancreatitis). How is this diagnosed? Your health care provider may diagnose dyslipidemia based on a routine blood test (fasting blood test). Because most people do not have symptoms of the condition, this blood testing (lipid profile) is done on adults age 20 and older and is repeated every 4-6 years. This test checks: Total cholesterol. This measures the total amount of cholesterol in your blood, including LDL cholesterol, HDL cholesterol, and triglycerides. A healthy number is below 200 mg/dL (5.17 mmol/L). LDL cholesterol. The target number for LDL cholesterol is different for each person, depending on individual risk factors. A healthy number is usually below 100 mg/dL (2.59 mmol/L). Ask your health care provider what your LDL cholesterol should be. HDL cholesterol. An HDL level of 60 mg/dL (1.55 mmol/L) or higher is best because it helps to protect against heart disease. A number below 40 mg/dL (1.03 mmol/L) for men or below 50 mg/dL (1.29 mmol/L) for women increases the risk for heart disease. Triglycerides. A healthy triglyceride number is below 150 mg/dL (1.69 mmol/L). If your lipid profile is abnormal, your health care provider may do other blood tests. How is this treated? Treatment depends on the type of dyslipidemia that you have and your other risk factors for heart disease and stroke. Your health care provider will have a target range for your lipid levels based on this information. Treatment for dyslipidemia starts with lifestyle changes, such as diet and exercise. Your health care provider may recommend that you: Get regular exercise. Make changes to your diet. Quit smoking if you smoke. Limit your alcohol intake. If diet   changes and exercise do not help you reach your goals, your health care provider  may also prescribe medicine to lower lipids. The most commonly prescribed type of medicine lowers your LDL cholesterol (statin drug). If you have a high triglyceride level, your provider may prescribe another type of drug (fibrate) or an omega-3 fish oil supplement, or both. Follow these instructions at home: Eating and drinking  Follow instructions from your health care provider or dietitian about eating or drinking restrictions. Eat a healthy diet as told by your health care provider. This can help you reach and maintain a healthy weight, lower your LDL cholesterol, and raise your HDL cholesterol. This may include: Limiting your calories, if you are overweight. Eating more fruits, vegetables, whole grains, fish, and lean meats. Limiting saturated fat, trans fat, and cholesterol. Do not drink alcohol if: Your health care provider tells you not to drink. You are pregnant, may be pregnant, or are planning to become pregnant. If you drink alcohol: Limit how much you have to: 0-1 drink a day for women. 0-2 drinks a day for men. Know how much alcohol is in your drink. In the [place] one drink equals one 12 oz bottle of beer (355 mL), one 5 oz glass of wine (148 mL), or one 1 oz glass of hard liquor (44 mL). Activity Get regular exercise. Start an exercise and strength training program as told by your health care provider. Ask your health care provider what activities are safe for you. Your health care provider may recommend: 30 minutes of aerobic activity 4-6 days a week. Brisk walking is an example of aerobic activity. Strength training 2 days a week. General instructions Do not use any products that contain nicotine or tobacco. These products include cigarettes, chewing tobacco, and vaping devices, such as e-cigarettes. If you need help quitting, ask your health care provider. Take over-the-counter and prescription medicines only as told by your health care provider. This includes  supplements. Keep all follow-up visits. This is important. Contact a health care provider if: You are having trouble sticking to your exercise or diet plan. You are struggling to quit smoking or to control your use of alcohol. Summary Dyslipidemia often involves a high level of cholesterol or triglycerides, which are types of lipids. Treatment depends on the type of dyslipidemia that you have and your other risk factors for heart disease and stroke. Treatment for dyslipidemia starts with lifestyle changes, such as diet and exercise. Your health care provider may prescribe medicine to lower lipids. This information is not intended to replace advice given to you by your health care provider. Make sure you discuss any questions you have with your health care provider. Document Revised: 08/11/2021 Document Reviewed: 03/14/2020 Elsevier Patient Education  2024 Elsevier Inc.  

## 2022-08-28 ENCOUNTER — Ambulatory Visit (HOSPITAL_BASED_OUTPATIENT_CLINIC_OR_DEPARTMENT_OTHER)
Admission: RE | Admit: 2022-08-28 | Discharge: 2022-08-28 | Disposition: A | Discharge status: Home / Self Care | Payer: Self-pay | Source: Ambulatory Visit | Attending: Family Medicine | Admitting: Family Medicine

## 2022-08-28 DIAGNOSIS — E785 Hyperlipidemia, unspecified: Secondary | ICD-10-CM | POA: Insufficient documentation

## 2022-09-06 ENCOUNTER — Other Ambulatory Visit: Payer: Self-pay | Admitting: Family Medicine

## 2022-09-06 DIAGNOSIS — Z1231 Encounter for screening mammogram for malignant neoplasm of breast: Secondary | ICD-10-CM

## 2022-10-16 ENCOUNTER — Ambulatory Visit
Admission: RE | Admit: 2022-10-16 | Discharge: 2022-10-16 | Disposition: A | Discharge status: Home / Self Care | Payer: Medicare Other | Source: Ambulatory Visit | Attending: Family Medicine | Admitting: Family Medicine

## 2022-10-16 DIAGNOSIS — Z1231 Encounter for screening mammogram for malignant neoplasm of breast: Secondary | ICD-10-CM

## 2023-05-20 ENCOUNTER — Encounter

## 2023-06-25 ENCOUNTER — Ambulatory Visit (INDEPENDENT_AMBULATORY_CARE_PROVIDER_SITE_OTHER): Admitting: *Deleted

## 2023-06-25 VITALS — Ht 63.0 in | Wt 96.0 lb

## 2023-06-25 DIAGNOSIS — M8580 Other specified disorders of bone density and structure, unspecified site: Secondary | ICD-10-CM

## 2023-06-25 DIAGNOSIS — Z780 Asymptomatic menopausal state: Secondary | ICD-10-CM | POA: Diagnosis not present

## 2023-06-25 DIAGNOSIS — Z0000 Encounter for general adult medical examination without abnormal findings: Secondary | ICD-10-CM

## 2023-06-25 NOTE — Patient Instructions (Signed)
 Ms. Cham , Thank you for taking time out of your busy schedule to complete your Annual Wellness Visit with me. I enjoyed our conversation and look forward to speaking with you again next year. I, as well as your care team,  appreciate your ongoing commitment to your health goals. Please review the following plan we discussed and let me know if I can assist you in the future. Your Game plan/ To Do List    Referrals: If you haven't heard from the office you've been referred to, please reach out to them at the phone provided.  [facility]:  Address: [address], [place], [place] [address]                           Phone: [phone] Follow up Visits: Next Medicare AWV with our clinical staff: 06/25/24 9:40am   Next Office Visit with your provider: 08/22/23 8:20am  Clinician Recommendations:  Aim for 30 minutes of exercise or brisk walking, 6-8 glasses of water, and 5 servings of fruits and vegetables each day.       This is a list of the screening recommended for you and due dates:  Health Maintenance  Topic Date Due   DEXA scan (bone density measurement)  11/08/2022   COVID-19 Vaccine (6 - 2024-25 season) 06/24/2024*   Flu Shot  08/23/2023   Mammogram  10/16/2023   Medicare Annual Wellness Visit  06/24/2024   DTaP/Tdap/Td vaccine (2 - Td or Tdap) 03/02/2025   Colon Cancer Screening  08/17/2030   Pneumonia Vaccine  Completed   Hepatitis C Screening  Completed   Zoster (Shingles) Vaccine  Completed   HPV Vaccine  Aged Out   Meningitis B Vaccine  Aged Out  *Topic was postponed. The date shown is not the original due date.   When you have completed your advanced care planning documents, you may either personally take them into the office or mail/email them to the addresses listed below:    Advanced directives: (Copy Requested) Please bring a copy of your health care power of attorney and living will to the office to be added to your chart at your convenience. You can mail to [facility] [address] [place], [place] [address] or email to [email or web address] Advance Care Planning is important because it:    See attachments for Preventive Care and Fall Prevention Tips.

## 2023-06-25 NOTE — Progress Notes (Signed)
 Subjective:   [name] is a 68 y.o. who presents for a Medicare Wellness preventive visit.  As a reminder, Annual Wellness Visits don't include a physical exam, and some assessments may be limited, especially if this visit is performed virtually. We may recommend an in-person follow-up visit with your provider if needed.  Visit Complete: Virtual I connected with  [name] on 06/25/23 by a audio enabled telemedicine application and verified that I am speaking with the correct person using two identifiers.  Patient Location: Home  Provider Location: Office/Clinic  I discussed the limitations of evaluation and management by telemedicine. The patient expressed understanding and agreed to proceed.  Vital Signs: Because this visit was a virtual/telehealth visit, some criteria may be missing or patient reported. Any vitals not documented were not able to be obtained and vitals that have been documented are patient reported.  VideoDeclined- This patient declined Librarian, academic. Therefore the visit was completed with audio only.  Persons Participating in Visit: Patient.  AWV Questionnaire: Yes: Patient Medicare AWV questionnaire was completed by the patient on 06/24/23; I have confirmed that all information answered by patient is correct and no changes since this date.  Cardiac Risk Factors include: dyslipidemia;advanced age (>73men, >52 women)     Objective:     Today's Vitals   06/25/23 0944  Weight: 96 lb (43.5 kg)  Height: 5\' 3"  (1.6 m)   Body mass index is 17.01 kg/m.     06/25/2023   10:00 AM 05/11/2022   12:29 PM 05/11/2022    8:37 AM 05/09/2021    1:41 PM 04/21/2020   12:38 PM 04/28/2015    7:18 AM 04/14/2015    1:06 PM  Advanced Directives  Does Patient Have a Medical Advance Directive? No No No No No No No  Would patient like information on creating a medical advance directive? No - Patient declined No - Patient declined No - Patient  declined Yes (MAU/Ambulatory/Procedural Areas - Information given) Yes (MAU/Ambulatory/Procedural Areas - Information given)      Current Medications (verified) Outpatient Encounter Medications as of 06/25/2023  Medication Sig   aspirin 81 MG tablet Take 81 mg by mouth daily.   Calcium Citrate-Vitamin D 200-250 MG-UNIT TABS Take 1 tablet by mouth daily.   Lactobacillus-Inulin (CULTURELLE DIGESTIVE HEALTH) CAPS    MegaRed Omega-3 Krill Oil 500 MG CAPS Take 1 capsule by mouth daily.   Multiple Vitamins-Minerals (MULTIVITAMIN WITH MINERALS) tablet Take 1 tablet by mouth daily.   No facility-administered encounter medications on file as of 06/25/2023.    Allergies (verified) Duricef [cefadroxil monohydrate]   History: Past Medical History:  Diagnosis Date   Allergy 2000   Duricef   Cataract 02/2016   noted upon yearly eye exam   Hyperlipidemia    Osteopenia    Past Surgical History:  Procedure Laterality Date   APPENDECTOMY  2012   COLONOSCOPY     Family History  Problem Relation Age of Onset   Heart disease Father    Alcohol abuse Father    Hyperlipidemia Father    Hypertension Father    Stroke Mother    Heart disease Sister    Heart disease Brother    Heart disease Sister    Heart disease Brother    Colon cancer Neg Hx    Esophageal cancer Neg Hx    Rectal cancer Neg Hx    Stomach cancer Neg Hx    Social History   Socioeconomic History  Marital status: Married    Spouse name: Not on file   Number of children: Not on file   Years of education: Not on file   Highest education level: Some college, no degree  Occupational History   Occupation: retired  Tobacco Use   Smoking status: Never   Smokeless tobacco: Never  Vaping Use   Vaping status: Never Used  Substance and Sexual Activity   Alcohol use: Yes    Alcohol/week: 5.0 standard drinks of alcohol    Types: 5 Glasses of wine per week    Comment: wine daily   Drug use: No   Sexual activity: Yes     Partners: Male    Birth control/protection: Post-menopausal  Other Topics Concern   Not on file  Social History Narrative   Exercise 6 days a week   Live at home with husband   Social Drivers of Corporate investment banker Strain: Low Risk  (06/24/2023)   Overall Financial Resource Strain (CARDIA)    Difficulty of Paying Living Expenses: Not hard at all  Food Insecurity: No Food Insecurity (06/24/2023)   Hunger Vital Sign    Worried About Running Out of Food in the Last Year: Never true    Ran Out of Food in the Last Year: Never true  Transportation Needs: No Transportation Needs (06/24/2023)   PRAPARE - Administrator, Civil Service (Medical): No    Lack of Transportation (Non-Medical): No  Physical Activity: Sufficiently Active (06/24/2023)   Exercise Vital Sign    Days of Exercise per Week: 6 days    Minutes of Exercise per Session: 60 min  Stress: No Stress Concern Present (06/24/2023)   [employer] of Occupational Health - Occupational Stress Questionnaire    Feeling of Stress : Not at all  Social Connections: Moderately Isolated (06/24/2023)   Social Connection and Isolation Panel [NHANES]    Frequency of Communication with Friends and Family: More than three times a week    Frequency of Social Gatherings with Friends and Family: Once a week    Attends Religious Services: Never    Database administrator or Organizations: No    Attends Engineer, structural: Not on file    Marital Status: Married    Tobacco Counseling Counseling given: Not Answered    Clinical Intake:  Pre-visit preparation completed: Yes  Pain : No/denies pain     BMI - recorded: 17.01 Nutritional Status: BMI <19  Underweight Nutritional Risks: None (States she is always this weight.  Lost 5 pounds over the last 5 years. States she does what she can as far as diet and exercise) Diabetes: No  No results found for: "HGBA1C"   How often do you need to have someone help you  when you read instructions, pamphlets, or other written materials from your doctor or pharmacy?: 1 - Never What is the last grade level you completed in school?: 12, some college classes  Interpreter Needed?: No  Information entered by :: Susa Engman, CMA   Activities of Daily Living     06/25/2023    9:53 AM 06/24/2023    1:05 PM  In your present state of health, do you have any difficulty performing the following activities:  Hearing? 0 0  Vision? 0 0  Comment has bilateral cataracte.  Last eye exam 04/2023 with Dr Rosary Como at [facility].   Difficulty concentrating or making decisions? 0 0  Walking or climbing stairs? 0 0  Dressing or bathing? 0 0  Doing errands, shopping? 0 0  Preparing Food and eating ? N N  Using the Toilet? N N  In the past six months, have you accidently leaked urine? N N  Do you have problems with loss of bowel control? N N  Managing your Medications? N N  Managing your Finances? N N  Housekeeping or managing your Housekeeping? N N    Patient Care Team: Crecencio Dodge, Candida Chalk, DO as PCP - General (Family Medicine) [place], Thornell Flirt, MD as Referring Physician (Dermatology) Corie Diamond, MD as Consulting Physician (Ophthalmology) I have updated your Care Teams any recent Medical Services you may have received from other providers in the past year.     Assessment:    This is a routine wellness examination for [name].  Hearing/Vision screen Hearing Screening - Comments:: Denies difficulty Vision Screening - Comments:: Last exam with [facility] on 04/2023   Goals Addressed   None    Depression Screen     06/25/2023    9:59 AM 05/11/2022   12:31 PM 05/11/2022    8:50 AM 05/09/2021    1:40 PM 04/21/2020   12:41 PM 04/06/2019    8:21 AM  PHQ 2/9 Scores  PHQ - 2 Score 0 0 0 0 0 0   No changes since 06/25/23 Fall Risk     06/24/2023    1:05 PM 05/20/2023    6:17 AM 05/11/2022   12:29 PM 05/11/2022    8:35 AM 05/09/2021    1:43 PM  Fall Risk    Falls in the past year? 0 0 0 0 0  Number falls in past yr: 0 0 0 0 0  Injury with Fall? 0 0 0 0 0  Risk for fall due to : No Fall Risks  No Fall Risks No Fall Risks   Follow up Education provided  Falls prevention discussed Falls prevention discussed Falls prevention discussed    MEDICARE RISK AT HOME:  Medicare Risk at Home Any stairs in or around the home?: Yes If so, are there any without handrails?: No Home free of loose throw rugs in walkways, pet beds, electrical cords, etc?: Yes Adequate lighting in your home to reduce risk of falls?: Yes Life alert?: No Use of a cane, walker or w/c?: No Grab bars in the bathroom?: No Shower chair or bench in shower?: Yes Elevated toilet seat or a handicapped toilet?: No  TIMED UP AND GO:  Was the test performed?  No, audio visit  Cognitive Function: 6CIT completed    04/06/2019    8:52 AM  MMSE - Mini Mental State Exam  Orientation to time 5  Orientation to Place 5  Registration 3  Attention/ Calculation 5  Recall 3  Language- name 2 objects 2  Language- repeat 1  Language- follow 3 step command 3  Language- read & follow direction 1  Write a sentence 1  Copy design 1  Total score 30        06/25/2023   10:12 AM 05/11/2022   12:30 PM 05/11/2022    8:43 AM 05/09/2021    1:44 PM  6CIT Screen  What Year? 0 points 0 points 0 points 0 points  What month? 0 points 0 points 0 points 0 points  What time? 0 points 0 points 0 points 0 points  Count back from 20 0 points 0 points 0 points 0 points  Months in reverse 0 points 0 points 0 points 0  points  Repeat phrase 0 points 0 points 0 points 0 points  Total Score 0 points 0 points 0 points 0 points    Immunizations Immunization History  Administered Date(s) Administered   Influenza Split 10/26/2019   Influenza,inj,Quad PF,6+ Mos 10/12/2015   Influenza-Unspecified 10/23/2014, 10/29/2016, 10/09/2017, 10/27/2018   PFIZER(Purple Top)SARS-COV-2 Vaccination 03/05/2019, 03/30/2019,  10/26/2019, 05/18/2020, 10/04/2020   PNEUMOCOCCAL CONJUGATE-20 08/20/2022   Pneumococcal Polysaccharide-23 05/05/2020   Tdap 03/03/2015   Zoster Recombinant(Shingrix) 06/05/2016, 10/08/2016   Zoster, Live 03/03/2015    Screening Tests Health Maintenance  Topic Date Due   DEXA SCAN  11/08/2022   Medicare Annual Wellness (AWV)  05/11/2023   COVID-19 Vaccine (6 - 2024-25 season) 06/24/2024 (Originally 09/23/2022)   INFLUENZA VACCINE  08/23/2023   MAMMOGRAM  10/16/2023   DTaP/Tdap/Td (2 - Td or Tdap) 03/02/2025   Colonoscopy  08/17/2030   Pneumonia Vaccine 31+ Years old  Completed   Hepatitis C Screening  Completed   Zoster Vaccines- Shingrix  Completed   HPV VACCINES  Aged Out   Meningococcal B Vaccine  Aged Out    Health Maintenance  Health Maintenance Due  Topic Date Due   DEXA SCAN  11/08/2022   Medicare Annual Wellness (AWV)  05/11/2023   Health Maintenance Items Addressed: Declines COVID vaccine but knows to check with pharmacy if she changes her mind. DEXA ordered today.  Additional Screening:  Vision Screening: Recommended annual ophthalmology exams for early detection of glaucoma and other disorders of the eye. Would you like a referral to an eye doctor? No    Dental Screening: Recommended annual dental exams for proper oral hygiene  Community Resource Referral / Chronic Care Management: CRR required this visit?  No   CCM required this visit?  No   Plan:    I have personally reviewed and noted the following in the patient's chart:   Medical and social history Use of alcohol, tobacco or illicit drugs  Current medications and supplements including opioid prescriptions. Patient is not currently taking opioid prescriptions. Functional ability and status Nutritional status Physical activity Advanced directives List of other physicians Hospitalizations, surgeries, and ER visits in previous 12 months Vitals Screenings to include cognitive, depression, and  falls Referrals and appointments  In addition, I have reviewed and discussed with patient certain preventive protocols, quality metrics, and best practice recommendations. A written personalized care plan for preventive services as well as general preventive health recommendations were provided to patient.   Susa Engman, CMA   06/25/2023   After Visit Summary: (MyChart) Due to this being a telephonic visit, the after visit summary with patients personalized plan was offered to patient via MyChart   Notes: Nothing significant to report at this time.

## 2023-08-22 ENCOUNTER — Ambulatory Visit: Payer: Medicare Other | Admitting: Family Medicine

## 2023-08-27 ENCOUNTER — Ambulatory Visit (INDEPENDENT_AMBULATORY_CARE_PROVIDER_SITE_OTHER): Admitting: Family Medicine

## 2023-08-27 ENCOUNTER — Encounter: Payer: Self-pay | Admitting: Family Medicine

## 2023-08-27 VITALS — BP 120/84 | HR 50 | Temp 97.8°F | Resp 16 | Ht 63.0 in | Wt 96.0 lb

## 2023-08-27 DIAGNOSIS — M8580 Other specified disorders of bone density and structure, unspecified site: Secondary | ICD-10-CM | POA: Insufficient documentation

## 2023-08-27 DIAGNOSIS — Z780 Asymptomatic menopausal state: Secondary | ICD-10-CM | POA: Diagnosis not present

## 2023-08-27 DIAGNOSIS — E785 Hyperlipidemia, unspecified: Secondary | ICD-10-CM | POA: Diagnosis not present

## 2023-08-27 DIAGNOSIS — R636 Underweight: Secondary | ICD-10-CM | POA: Diagnosis not present

## 2023-08-27 LAB — CBC WITH DIFFERENTIAL/PLATELET
Basophils Absolute: 0 K/uL (ref 0.0–0.1)
Basophils Relative: 1.3 % (ref 0.0–3.0)
Eosinophils Absolute: 0.1 K/uL (ref 0.0–0.7)
Eosinophils Relative: 1.4 % (ref 0.0–5.0)
HCT: 45 % (ref 36.0–46.0)
Hemoglobin: 15 g/dL (ref 12.0–15.0)
Lymphocytes Relative: 28.5 % (ref 12.0–46.0)
Lymphs Abs: 1 K/uL (ref 0.7–4.0)
MCHC: 33.2 g/dL (ref 30.0–36.0)
MCV: 100.6 fl — ABNORMAL HIGH (ref 78.0–100.0)
Monocytes Absolute: 0.3 K/uL (ref 0.1–1.0)
Monocytes Relative: 9.2 % (ref 3.0–12.0)
Neutro Abs: 2.1 K/uL (ref 1.4–7.7)
Neutrophils Relative %: 59.6 % (ref 43.0–77.0)
Platelets: 197 K/uL (ref 150.0–400.0)
RBC: 4.48 Mil/uL (ref 3.87–5.11)
RDW: 12.9 % (ref 11.5–15.5)
WBC: 3.6 K/uL — ABNORMAL LOW (ref 4.0–10.5)

## 2023-08-27 LAB — COMPREHENSIVE METABOLIC PANEL WITH GFR
ALT: 23 U/L (ref 0–35)
AST: 18 U/L (ref 0–37)
Albumin: 4.6 g/dL (ref 3.5–5.2)
Alkaline Phosphatase: 42 U/L (ref 39–117)
BUN: 15 mg/dL (ref 6–23)
CO2: 33 meq/L — ABNORMAL HIGH (ref 19–32)
Calcium: 9.9 mg/dL (ref 8.4–10.5)
Chloride: 102 meq/L (ref 96–112)
Creatinine, Ser: 0.73 mg/dL (ref 0.40–1.20)
GFR: 83.84 mL/min (ref >60.00)
Glucose, Bld: 103 mg/dL — ABNORMAL HIGH (ref 70–99)
Potassium: 6 meq/L — ABNORMAL HIGH (ref 3.5–5.1)
Sodium: 141 meq/L (ref 135–145)
Total Bilirubin: 0.7 mg/dL (ref 0.2–1.2)
Total Protein: 7 g/dL (ref 6.0–8.3)

## 2023-08-27 LAB — LIPID PANEL
Cholesterol: 234 mg/dL — ABNORMAL HIGH (ref 0–200)
HDL: 90.7 mg/dL (ref >39.00)
LDL Cholesterol: 130 mg/dL — ABNORMAL HIGH (ref 0–99)
NonHDL: 143.67
Total CHOL/HDL Ratio: 3
Triglycerides: 67 mg/dL (ref 0.0–149.0)
VLDL: 13.4 mg/dL (ref 0.0–40.0)

## 2023-08-27 LAB — TSH: TSH: 2.05 u[IU]/mL (ref 0.35–5.50)

## 2023-08-27 NOTE — Assessment & Plan Note (Signed)
 Con't weight bearing exercise  Con't ca and vita d3 Check bmd

## 2023-08-27 NOTE — Assessment & Plan Note (Signed)
 Encourage heart healthy diet such as MIND or DASH diet, increase exercise, avoid trans fats, simple carbohydrates and processed foods, consider a krill or fish or flaxseed oil cap daily.

## 2023-08-27 NOTE — Progress Notes (Signed)
 Subjective:    Patient ID: [name], female    DOB: 11-06-1955, 68 y.o.   MRN: [id number]  Chief Complaint  Patient presents with   Annual Exam    Pt states fasting     HPI Patient is in today for cpe. Discussed the use of AI scribe software for clinical note transcription with the patient, who gave verbal consent to proceed.  History of Present Illness [name] is a 68 year old female who presents for a routine physical exam.  She has no current complaints and feels energetic. Despite efforts to gain weight, she has maintained the same weight for the past year. She has reduced her wine consumption from five glasses a week to two or three, opting for sparkling water instead. She eats three meals a day but gets full quickly and sometimes has a small snack or hot chocolate before dinner. She drinks more milk but has not noticed any weight gain.  She exercises six days a week. She plans to visit a dermatologist. She is not experiencing any depression and maintains a positive outlook, although she occasionally feels overwhelmed by the news.  Her past medical history includes regular eye and dental check-ups, with her last visits in April. She has no changes in her surgical or family history. She does not have a living will or power of attorney but has the paperwork ready and plans to set it up with an estate planner.   Past Medical History:  Diagnosis Date   Allergy 2000   Duricef   Cataract 02/2016   noted upon yearly eye exam   Hyperlipidemia    Osteopenia     Past Surgical History:  Procedure Laterality Date   APPENDECTOMY  2012   COLONOSCOPY      Family History  Problem Relation Age of Onset   Heart disease Father    Alcohol abuse Father    Hyperlipidemia Father    Hypertension Father    Stroke Mother    Heart disease Sister    Heart disease Brother    Heart disease Sister    Heart disease Brother    Colon cancer Neg Hx    Esophageal cancer Neg Hx     Rectal cancer Neg Hx    Stomach cancer Neg Hx     Social History   Socioeconomic History   Marital status: Married    Spouse name: Not on file   Number of children: Not on file   Years of education: Not on file   Highest education level: Some college, no degree  Occupational History   Occupation: retired  Tobacco Use   Smoking status: Never   Smokeless tobacco: Never  Vaping Use   Vaping status: Never Used  Substance and Sexual Activity   Alcohol use: Yes    Alcohol/week: 3.0 standard drinks of alcohol    Types: 3 Glasses of wine per week   Drug use: No   Sexual activity: Yes    Partners: Male    Birth control/protection: Post-menopausal  Other Topics Concern   Not on file  Social History Narrative   Exercise 6 days a week   Live at home with husband   Social Drivers of Corporate investment banker Strain: Low Risk  (08/26/2023)   Overall Financial Resource Strain (CARDIA)    Difficulty of Paying Living Expenses: Not hard at all  Food Insecurity: No Food Insecurity (08/26/2023)   Hunger Vital Sign    Worried  About Running Out of Food in the Last Year: Never true    Ran Out of Food in the Last Year: Never true  Transportation Needs: No Transportation Needs (08/26/2023)   PRAPARE - Administrator, Civil Service (Medical): No    Lack of Transportation (Non-Medical): No  Physical Activity: Sufficiently Active (08/26/2023)   Exercise Vital Sign    Days of Exercise per Week: 6 days    Minutes of Exercise per Session: 60 min  Stress: No Stress Concern Present (08/26/2023)   [employer] of Occupational Health - Occupational Stress Questionnaire    Feeling of Stress: Not at all  Social Connections: Moderately Integrated (08/26/2023)   Social Connection and Isolation Panel    Frequency of Communication with Friends and Family: More than three times a week    Frequency of Social Gatherings with Friends and Family: Once a week    Attends Religious Services: Never     Database administrator or Organizations: Yes    Attends Engineer, structural: 1 to 4 times per year    Marital Status: Married  Recent Concern: Social Connections - Moderately Isolated (06/24/2023)   Social Connection and Isolation Panel    Frequency of Communication with Friends and Family: More than three times a week    Frequency of Social Gatherings with Friends and Family: Once a week    Attends Religious Services: Never    Database administrator or Organizations: No    Attends Engineer, structural: Not on file    Marital Status: Married  Catering manager Violence: Not At Risk (06/25/2023)   Humiliation, Afraid, Rape, and Kick questionnaire    Fear of Current or Ex-Partner: No    Emotionally Abused: No    Physically Abused: No    Sexually Abused: No    Outpatient Medications Prior to Visit  Medication Sig Dispense Refill   aspirin 81 MG tablet Take 81 mg by mouth daily.     Calcium Citrate-Vitamin D 200-250 MG-UNIT TABS Take 1 tablet by mouth daily.     Lactobacillus-Inulin (CULTURELLE DIGESTIVE HEALTH) CAPS      MegaRed Omega-3 Krill Oil 500 MG CAPS Take 1 capsule by mouth daily.     Multiple Vitamins-Minerals (MULTIVITAMIN WITH MINERALS) tablet Take 1 tablet by mouth daily.     No facility-administered medications prior to visit.    Allergies  Allergen Reactions   Duricef [Cefadroxil Monohydrate] Rash    Review of Systems  Constitutional:  Negative for chills, fever and malaise/fatigue.  HENT:  Negative for congestion and hearing loss.   Eyes:  Negative for discharge.  Respiratory:  Negative for cough, sputum production and shortness of breath.   Cardiovascular:  Negative for chest pain, palpitations and leg swelling.  Gastrointestinal:  Negative for abdominal pain, blood in stool, constipation, diarrhea, heartburn, nausea and vomiting.  Genitourinary:  Negative for dysuria, frequency, hematuria and urgency.  Musculoskeletal:  Negative for back pain,  falls and myalgias.  Skin:  Negative for rash.  Neurological:  Negative for dizziness, sensory change, loss of consciousness, weakness and headaches.  Endo/Heme/Allergies:  Negative for environmental allergies. Does not bruise/bleed easily.  Psychiatric/Behavioral:  Negative for depression and suicidal ideas. The patient is not nervous/anxious and does not have insomnia.        Objective:    Physical Exam Vitals and nursing note reviewed.  Constitutional:      General: She is not in acute distress.    Appearance: Normal  appearance. She is well-developed.  HENT:     Head: Normocephalic and atraumatic.     Right Ear: Tympanic membrane, ear canal and external ear normal. There is no impacted cerumen.     Left Ear: Tympanic membrane, ear canal and external ear normal. There is no impacted cerumen.     Nose: Nose normal.     Mouth/Throat:     Mouth: Mucous membranes are moist.     Pharynx: Oropharynx is clear. No oropharyngeal exudate or posterior oropharyngeal erythema.  Eyes:     General: No scleral icterus.       Right eye: No discharge.        Left eye: No discharge.     Conjunctiva/sclera: Conjunctivae normal.     Pupils: Pupils are equal, round, and reactive to light.  Neck:     Thyroid : No thyromegaly or thyroid  tenderness.     Vascular: No JVD.  Cardiovascular:     Rate and Rhythm: Normal rate and regular rhythm.     Heart sounds: Normal heart sounds. No murmur heard. Pulmonary:     Effort: Pulmonary effort is normal. No respiratory distress.     Breath sounds: Normal breath sounds.  Abdominal:     General: Bowel sounds are normal. There is no distension.     Palpations: Abdomen is soft. There is no mass.     Tenderness: There is no abdominal tenderness. There is no guarding or rebound.  Genitourinary:    Vagina: Normal.  Musculoskeletal:        General: Normal range of motion.     Cervical back: Normal range of motion and neck supple.     Right lower leg: No edema.      Left lower leg: No edema.  Lymphadenopathy:     Cervical: No cervical adenopathy.  Skin:    General: Skin is warm and dry.     Findings: No erythema or rash.  Neurological:     Mental Status: She is alert and oriented to person, place, and time.     Cranial Nerves: No cranial nerve deficit.     Deep Tendon Reflexes: Reflexes are normal and symmetric.  Psychiatric:        Mood and Affect: Mood normal.        Behavior: Behavior normal.        Thought Content: Thought content normal.        Judgment: Judgment normal.     BP 120/84 (BP Location: Right Arm, Patient Position: Sitting, Cuff Size: Small)   Pulse (!) 50   Temp 97.8 F (36.6 C) (Oral)   Resp 16   Ht 5' 3 (1.6 m)   Wt 96 lb (43.5 kg)   SpO2 100%   BMI 17.01 kg/m  Wt Readings from Last 3 Encounters:  08/27/23 96 lb (43.5 kg)  06/25/23 96 lb (43.5 kg)  08/20/22 96 lb 6.4 oz (43.7 kg)    Diabetic Foot Exam - Simple   No data filed    Lab Results  Component Value Date   WBC 4.3 08/20/2022   HGB 14.4 08/20/2022   HCT 42.8 08/20/2022   PLT 327.0 08/20/2022   GLUCOSE 98 08/20/2022   CHOL 218 (H) 08/20/2022   TRIG 69.0 08/20/2022   HDL 70.10 08/20/2022   LDLCALC 135 (H) 08/20/2022   ALT 19 08/20/2022   AST 17 08/20/2022   NA 137 08/20/2022   K 4.8 08/20/2022   CL 99 08/20/2022   CREATININE 0.72 08/20/2022  BUN 16 08/20/2022   CO2 30 08/20/2022   TSH 1.33 08/20/2022    Lab Results  Component Value Date   TSH 1.33 08/20/2022   Lab Results  Component Value Date   WBC 4.3 08/20/2022   HGB 14.4 08/20/2022   HCT 42.8 08/20/2022   MCV 98.7 08/20/2022   PLT 327.0 08/20/2022   Lab Results  Component Value Date   NA 137 08/20/2022   K 4.8 08/20/2022   CO2 30 08/20/2022   GLUCOSE 98 08/20/2022   BUN 16 08/20/2022   CREATININE 0.72 08/20/2022   BILITOT 0.7 08/20/2022   ALKPHOS 44 08/20/2022   AST 17 08/20/2022   ALT 19 08/20/2022   PROT 6.7 08/20/2022   ALBUMIN 4.3 08/20/2022   CALCIUM  9.5 08/20/2022   GFR 85.85 08/20/2022   Lab Results  Component Value Date   CHOL 218 (H) 08/20/2022   Lab Results  Component Value Date   HDL 70.10 08/20/2022   Lab Results  Component Value Date   LDLCALC 135 (H) 08/20/2022   Lab Results  Component Value Date   TRIG 69.0 08/20/2022   Lab Results  Component Value Date   CHOLHDL 3 08/20/2022   No results found for: HGBA1C     Assessment & Plan:  Underweight -     DG Bone Density; Future -     CBC with Differential/Platelet -     Comprehensive metabolic panel with GFR -     Lipid panel -     TSH  Osteopenia, unspecified location Assessment & Plan: Con't weight bearing exercise  Con't ca and vita d3 Check bmd    Orders: -     DG Bone Density; Future -     CBC with Differential/Platelet -     Comprehensive metabolic panel with GFR -     Lipid panel -     TSH  Postmenopausal estrogen deficiency -     DG Bone Density; Future -     CBC with Differential/Platelet -     Comprehensive metabolic panel with GFR -     Lipid panel -     TSH  Hyperlipidemia, unspecified hyperlipidemia type Assessment & Plan: Encourage heart healthy diet such as MIND or DASH diet, increase exercise, avoid trans fats, simple carbohydrates and processed foods, consider a krill or fish or flaxseed oil cap daily.    Orders: -     Comprehensive metabolic panel with GFR -     Lipid panel -     TSH  Assessment and Plan Assessment & Plan Adult Wellness Visit   Her weight is well-managed with no changes over the past year. She maintains consistent energy levels, shows no signs of malnutrition, and exhibits no depressive symptoms. Alcohol intake is reduced to two to three glasses of wine per week. She engages in regular exercise six days a week. There are no joint issues, concerning moles, or elevated blood sugar levels. She attends regular eye and dental check-ups. [facility]. Discuss the RSV vaccine for individuals over 65, available at the pharmacy. Continue regular exercise and a balanced diet. Encourage completion of living will and power of attorney paperwork. Consider a dermatology appointment for skin evaluation.    Sigfredo Schreier R Lowne Chase, DO

## 2023-09-01 ENCOUNTER — Ambulatory Visit: Payer: Self-pay | Admitting: Family Medicine

## 2023-09-01 DIAGNOSIS — E875 Hyperkalemia: Secondary | ICD-10-CM

## 2023-09-04 ENCOUNTER — Ambulatory Visit: Payer: Self-pay | Admitting: Family Medicine

## 2023-09-04 ENCOUNTER — Other Ambulatory Visit (INDEPENDENT_AMBULATORY_CARE_PROVIDER_SITE_OTHER)

## 2023-09-04 DIAGNOSIS — E875 Hyperkalemia: Secondary | ICD-10-CM | POA: Diagnosis not present

## 2023-09-04 LAB — COMPREHENSIVE METABOLIC PANEL WITH GFR
ALT: 24 U/L (ref 0–35)
AST: 21 U/L (ref 0–37)
Albumin: 4.4 g/dL (ref 3.5–5.2)
Alkaline Phosphatase: 41 U/L (ref 39–117)
BUN: 11 mg/dL (ref 6–23)
CO2: 31 meq/L (ref 19–32)
Calcium: 9.2 mg/dL (ref 8.4–10.5)
Chloride: 99 meq/L (ref 96–112)
Creatinine, Ser: 0.74 mg/dL (ref 0.40–1.20)
GFR: 82.47 mL/min (ref >60.00)
Glucose, Bld: 98 mg/dL (ref 70–99)
Potassium: 5 meq/L (ref 3.5–5.1)
Sodium: 137 meq/L (ref 135–145)
Total Bilirubin: 0.8 mg/dL (ref 0.2–1.2)
Total Protein: 6.8 g/dL (ref 6.0–8.3)

## 2023-09-05 ENCOUNTER — Other Ambulatory Visit: Payer: Self-pay | Admitting: Family Medicine

## 2023-09-05 DIAGNOSIS — Z1231 Encounter for screening mammogram for malignant neoplasm of breast: Secondary | ICD-10-CM

## 2023-09-12 ENCOUNTER — Ambulatory Visit (HOSPITAL_BASED_OUTPATIENT_CLINIC_OR_DEPARTMENT_OTHER)
Admission: RE | Admit: 2023-09-12 | Discharge: 2023-09-12 | Disposition: A | Discharge status: Home / Self Care | Source: Ambulatory Visit | Attending: Family Medicine | Admitting: Family Medicine

## 2023-09-12 DIAGNOSIS — Z780 Asymptomatic menopausal state: Secondary | ICD-10-CM | POA: Diagnosis present

## 2023-09-12 DIAGNOSIS — R636 Underweight: Secondary | ICD-10-CM | POA: Insufficient documentation

## 2023-09-12 DIAGNOSIS — M8580 Other specified disorders of bone density and structure, unspecified site: Secondary | ICD-10-CM | POA: Diagnosis present

## 2023-09-27 ENCOUNTER — Other Ambulatory Visit: Payer: Self-pay

## 2023-09-27 MED ORDER — ALENDRONATE SODIUM 70 MG PO TABS: 70.0000 mg | ORAL_TABLET | ORAL | 3 refills | Status: AC

## 2023-10-11 ENCOUNTER — Other Ambulatory Visit: Payer: Self-pay | Admitting: Medical Genetics

## 2023-10-17 ENCOUNTER — Ambulatory Visit
Admission: RE | Admit: 2023-10-17 | Discharge: 2023-10-17 | Disposition: A | Discharge status: Home / Self Care | Source: Ambulatory Visit | Attending: Family Medicine | Admitting: Family Medicine

## 2023-10-17 DIAGNOSIS — Z1231 Encounter for screening mammogram for malignant neoplasm of breast: Secondary | ICD-10-CM

## 2023-10-23 ENCOUNTER — Other Ambulatory Visit

## 2023-11-07 ENCOUNTER — Other Ambulatory Visit

## 2023-11-07 DIAGNOSIS — Z006 Encounter for examination for normal comparison and control in clinical research program: Secondary | ICD-10-CM

## 2023-11-18 LAB — GENECONNECT MOLECULAR SCREEN: Genetic Analysis Overall Interpretation: NEGATIVE

## 2024-06-25 ENCOUNTER — Ambulatory Visit

## 2024-08-27 ENCOUNTER — Ambulatory Visit: Admitting: Family Medicine
